# Patient Record
Sex: Female | Born: 1990
Health system: Southern US, Community
[De-identification: ages and names within clinical notes are randomized; demographics above are authoritative.]

## PROBLEM LIST (undated history)

## (undated) DIAGNOSIS — Z789 Other specified health status: Secondary | ICD-10-CM

## (undated) HISTORY — DX: Other specified health status: Z78.9

## (undated) HISTORY — PX: NO PAST SURGERIES: SHX2092

---

## 2018-10-06 ENCOUNTER — Emergency Department (HOSPITAL_BASED_OUTPATIENT_CLINIC_OR_DEPARTMENT_OTHER)
Admission: EM | Admit: 2018-10-06 | Discharge: 2018-10-06 | Disposition: A | Discharge status: Home / Self Care | Payer: 59 | Attending: Emergency Medicine | Admitting: Emergency Medicine

## 2018-10-06 ENCOUNTER — Encounter (HOSPITAL_BASED_OUTPATIENT_CLINIC_OR_DEPARTMENT_OTHER): Payer: Self-pay | Admitting: Emergency Medicine

## 2018-10-06 DIAGNOSIS — T23251A Burn of second degree of right palm, initial encounter: Secondary | ICD-10-CM | POA: Insufficient documentation

## 2018-10-06 DIAGNOSIS — Y999 Unspecified external cause status: Secondary | ICD-10-CM | POA: Insufficient documentation

## 2018-10-06 DIAGNOSIS — Y93G9 Activity, other involving cooking and grilling: Secondary | ICD-10-CM | POA: Diagnosis not present

## 2018-10-06 DIAGNOSIS — X102XXA Contact with fats and cooking oils, initial encounter: Secondary | ICD-10-CM | POA: Diagnosis not present

## 2018-10-06 DIAGNOSIS — T23191A Burn of first degree of multiple sites of right wrist and hand, initial encounter: Secondary | ICD-10-CM

## 2018-10-06 DIAGNOSIS — Y92000 Kitchen of unspecified non-institutional (private) residence as  the place of occurrence of the external cause: Secondary | ICD-10-CM | POA: Diagnosis not present

## 2018-10-06 DIAGNOSIS — T23051A Burn of unspecified degree of right palm, initial encounter: Secondary | ICD-10-CM | POA: Diagnosis present

## 2018-10-06 MED ORDER — ACETAMINOPHEN 500 MG PO TABS
1000.0000 mg | ORAL_TABLET | Freq: Once | ORAL | Status: AC
  Administered 2018-10-06: 1000 mg via ORAL
  Filled 2018-10-06: qty 2

## 2018-10-06 MED ORDER — ACETAMINOPHEN 500 MG PO TABS
ORAL_TABLET | ORAL | Status: AC
  Filled 2018-10-06: qty 1

## 2018-10-06 MED ORDER — SILVER SULFADIAZINE 1 % EX CREA: 1.0000 "application " | TOPICAL_CREAM | Freq: Every day | CUTANEOUS | 0 refills | Status: DC

## 2018-10-06 NOTE — ED Notes (Signed)
ED Provider at bedside. 
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
## 2018-10-06 NOTE — Discharge Instructions (Signed)
Take Tylenol as needed for pain.
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
## 2018-10-24 ENCOUNTER — Ambulatory Visit (INDEPENDENT_AMBULATORY_CARE_PROVIDER_SITE_OTHER): Payer: 59 | Admitting: Obstetrics & Gynecology

## 2018-10-24 ENCOUNTER — Encounter: Payer: Self-pay | Admitting: Obstetrics & Gynecology

## 2018-10-24 ENCOUNTER — Other Ambulatory Visit: Payer: Self-pay

## 2018-10-24 DIAGNOSIS — Z124 Encounter for screening for malignant neoplasm of cervix: Secondary | ICD-10-CM | POA: Diagnosis not present

## 2018-10-24 DIAGNOSIS — Z3A08 8 weeks gestation of pregnancy: Secondary | ICD-10-CM

## 2018-10-24 DIAGNOSIS — Z3401 Encounter for supervision of normal first pregnancy, first trimester: Secondary | ICD-10-CM

## 2018-10-24 DIAGNOSIS — Z113 Encounter for screening for infections with a predominantly sexual mode of transmission: Secondary | ICD-10-CM

## 2018-10-24 DIAGNOSIS — Z3400 Encounter for supervision of normal first pregnancy, unspecified trimester: Secondary | ICD-10-CM | POA: Insufficient documentation

## 2018-10-24 MED ORDER — DOXYLAMINE-PYRIDOXINE 10-10 MG PO TBEC: 2.0000 | DELAYED_RELEASE_TABLET | Freq: Every day | ORAL | 3 refills | Status: DC

## 2018-10-24 MED ORDER — PRENATAL VITAMIN 27-0.8 MG PO TABS: 1.0000 | ORAL_TABLET | Freq: Every day | ORAL | 4 refills | Status: DC

## 2018-10-24 MED FILL — PRENATAL VITAMIN PLUS LOW I: 27-1 | 30 days supply | Qty: 30 | Fill #0

## 2018-10-24 NOTE — Patient Instructions (Signed)
First Trimester of Pregnancy The first trimester of pregnancy is from week 1 until the end of week 13 (months 1 through 3). A week after a sperm fertilizes an egg, the egg will implant on the wall of the uterus. This embryo will begin to develop into a baby. Genes from you and your partner will form the baby. The female genes will determine whether the baby will be a boy or a girl. At 6-8 weeks, the eyes and face will be formed, and the heartbeat can be seen on ultrasound. At the end of 12 weeks, all the baby's organs will be formed. Now that you are pregnant, you will want to do everything you can to have a healthy baby. Two of the most important things are to get good prenatal care and to follow your health care provider's instructions. Prenatal care is all the medical care you receive before the baby's birth. This care will help prevent, find, and treat any problems during the pregnancy and childbirth. Body changes during your first trimester Your body goes through many changes during pregnancy. The changes vary from woman to woman.  You may gain or lose a couple of pounds at first.  You may feel sick to your stomach (nauseous) and you may throw up (vomit). If the vomiting is uncontrollable, call your health care provider.  You may tire easily.  You may develop headaches that can be relieved by medicines. All medicines should be approved by your health care provider.  You may urinate more often. Painful urination may mean you have a bladder infection.  You may develop heartburn as a result of your pregnancy.  You may develop constipation because certain hormones are causing the muscles that push stool through your intestines to slow down.  You may develop hemorrhoids or swollen veins (varicose veins).  Your breasts may begin to grow larger and become tender. Your nipples may stick out more, and the tissue that surrounds them (areola) may become darker.  Your gums may bleed and may be  sensitive to brushing and flossing.  Dark spots or blotches (chloasma, mask of pregnancy) may develop on your face. This will likely fade after the baby is born.  Your menstrual periods will stop.  You may have a loss of appetite.  You may develop cravings for certain kinds of food.  You may have changes in your emotions from day to day, such as being excited to be pregnant or being concerned that something may go wrong with the pregnancy and baby.  You may have more vivid and strange dreams.  You may have changes in your hair. These can include thickening of your hair, rapid growth, and changes in texture. Some women also have hair loss during or after pregnancy, or hair that feels dry or thin. Your hair will most likely return to normal after your baby is born. What to expect at prenatal visits During a routine prenatal visit:  You will be weighed to make sure you and the baby are growing normally.  Your blood pressure will be taken.  Your abdomen will be measured to track your baby's growth.  The fetal heartbeat will be listened to between weeks 10 and 14 of your pregnancy.  Test results from any previous visits will be discussed. Your health care provider may ask you:  How you are feeling.  If you are feeling the baby move.  If you have had any abnormal symptoms, such as leaking fluid, bleeding, severe headaches, or abdominal   cramping.  If you are using any tobacco products, including cigarettes, chewing tobacco, and electronic cigarettes.  If you have any questions. Other tests that may be performed during your first trimester include:  Blood tests to find your blood type and to check for the presence of any previous infections. The tests will also be used to check for low iron levels (anemia) and protein on red blood cells (Rh antibodies). Depending on your risk factors, or if you previously had diabetes during pregnancy, you may have tests to check for high blood sugar  that affects pregnant women (gestational diabetes).  Urine tests to check for infections, diabetes, or protein in the urine.  An ultrasound to confirm the proper growth and development of the baby.  Fetal screens for spinal cord problems (spina bifida) and Down syndrome.  HIV (human immunodeficiency virus) testing. Routine prenatal testing includes screening for HIV, unless you choose not to have this test.  You may need other tests to make sure you and the baby are doing well. Follow these instructions at home: Medicines  Follow your health care provider's instructions regarding medicine use. Specific medicines may be either safe or unsafe to take during pregnancy.  Take a prenatal vitamin that contains at least 600 micrograms (mcg) of folic acid.  If you develop constipation, try taking a stool softener if your health care provider approves. Eating and drinking   Eat a balanced diet that includes fresh fruits and vegetables, whole grains, good sources of protein such as meat, eggs, or tofu, and low-fat dairy. Your health care provider will help you determine the amount of weight gain that is right for you.  Avoid raw meat and uncooked cheese. These carry germs that can cause birth defects in the baby.  Eating four or five small meals rather than three large meals a day may help relieve nausea and vomiting. If you start to feel nauseous, eating a few soda crackers can be helpful. Drinking liquids between meals, instead of during meals, also seems to help ease nausea and vomiting.  Limit foods that are high in fat and processed sugars, such as fried and sweet foods.  To prevent constipation: ? Eat foods that are high in fiber, such as fresh fruits and vegetables, whole grains, and beans. ? Drink enough fluid to keep your urine clear or pale yellow. Activity  Exercise only as directed by your health care provider. Most women can continue their usual exercise routine during  pregnancy. Try to exercise for 30 minutes at least 5 days a week. Exercising will help you: ? Control your weight. ? Stay in shape. ? Be prepared for labor and delivery.  Experiencing pain or cramping in the lower abdomen or lower back is a good sign that you should stop exercising. Check with your health care provider before continuing with normal exercises.  Try to avoid standing for long periods of time. Move your legs often if you must stand in one place for a long time.  Avoid heavy lifting.  Wear low-heeled shoes and practice good posture.  You may continue to have sex unless your health care provider tells you not to. Relieving pain and discomfort  Wear a good support bra to relieve breast tenderness.  Take warm sitz baths to soothe any pain or discomfort caused by hemorrhoids. Use hemorrhoid cream if your health care provider approves.  Rest with your legs elevated if you have leg cramps or low back pain.  If you develop varicose veins in   your legs, wear support hose. Elevate your feet for 15 minutes, 3-4 times a day. Limit salt in your diet. Prenatal care  Schedule your prenatal visits by the twelfth week of pregnancy. They are usually scheduled monthly at first, then more often in the last 2 months before delivery.  Write down your questions. Take them to your prenatal visits.  Keep all your prenatal visits as told by your health care provider. This is important. Safety  Wear your seat belt at all times when driving.  Make a list of emergency phone numbers, including numbers for family, friends, the hospital, and police and fire departments. General instructions  Ask your health care provider for a referral to a local prenatal education class. Begin classes no later than the beginning of month 6 of your pregnancy.  Ask for help if you have counseling or nutritional needs during pregnancy. Your health care provider can offer advice or refer you to specialists for help  with various needs.  Do not use hot tubs, steam rooms, or saunas.  Do not douche or use tampons or scented sanitary pads.  Do not cross your legs for long periods of time.  Avoid cat litter boxes and soil used by cats. These carry germs that can cause birth defects in the baby and possibly loss of the fetus by miscarriage or stillbirth.  Avoid all smoking, herbs, alcohol, and medicines not prescribed by your health care provider. Chemicals in these products affect the formation and growth of the baby.  Do not use any products that contain nicotine or tobacco, such as cigarettes and e-cigarettes. If you need help quitting, ask your health care provider. You may receive counseling support and other resources to help you quit.  Schedule a dentist appointment. At home, brush your teeth with a soft toothbrush and be gentle when you floss. Contact a health care provider if:  You have dizziness.  You have mild pelvic cramps, pelvic pressure, or nagging pain in the abdominal area.  You have persistent nausea, vomiting, or diarrhea.  You have a bad smelling vaginal discharge.  You have pain when you urinate.  You notice increased swelling in your face, hands, legs, or ankles.  You are exposed to fifth disease or chickenpox.  You are exposed to German measles (rubella) and have never had it. Get help right away if:  You have a fever.  You are leaking fluid from your vagina.  You have spotting or bleeding from your vagina.  You have severe abdominal cramping or pain.  You have rapid weight gain or loss.  You vomit blood or material that looks like coffee grounds.  You develop a severe headache.  You have shortness of breath.  You have any kind of trauma, such as from a fall or a car accident. Summary  The first trimester of pregnancy is from week 1 until the end of week 13 (months 1 through 3).  Your body goes through many changes during pregnancy. The changes vary from  woman to woman.  You will have routine prenatal visits. During those visits, your health care provider will examine you, discuss any test results you may have, and talk with you about how you are feeling. This information is not intended to replace advice given to you by your health care provider. Make sure you discuss any questions you have with your health care provider. Document Released: 01/24/2001 Document Revised: 01/12/2017 Document Reviewed: 01/12/2016 Elsevier Patient Education  2020 [employer]  

## 2018-10-24 NOTE — Progress Notes (Signed)
  Subjective:    Karen Patel is being seen today for her first obstetrical visit. G1P0  This is a planned pregnancy. She is at [redacted]w[redacted]d gestation. Her obstetrical history is significant for none. Relationship with FOB: spouse, living together. Patient does intend to breast feed. Pregnancy history fully reviewed. Pt speaks some English. Primary language [place].   Patient reports nausea and emesis. .  Review of Systems:   Review of Systems  Objective:     BP 106/75   Pulse (!) 104   Ht 5' 9.29" (1.76 m)   Wt 160 lb 1.3 oz (72.6 kg)   LMP 08/27/2018 (Exact Date)   BMI 23.44 kg/m  Physical Exam  Exam General Appearance:    Alert, cooperative, no distress, appears stated age  Head:    Normocephalic, without obvious abnormality, atraumatic  Eyes:    conjunctiva/corneas clear, EOM's intact, both eyes  Ears:    Normal external ear canals, both ears  Nose:   Nares normal, septum midline, mucosa normal, no drainage    or sinus tenderness  Throat:   Lips, mucosa, and tongue normal; teeth and gums normal  Neck:   Supple, symmetrical, trachea midline, no adenopathy;    thyroid:  no enlargement/tenderness/nodules  Back:     Symmetric, no curvature, ROM normal, no CVA tenderness  Lungs:     respirations unlabored  Chest Wall:    No tenderness or deformity   Heart:    Regular rate and rhythm  Breast Exam:    No tenderness, masses, or nipple abnormality  Abdomen:     Soft, non-tender, bowel sounds active all four quadrants,    no masses, no organomegaly  Genitalia:    Normal female without lesion, discharge or tenderness     Extremities:   Extremities normal, atraumatic, no cyanosis or edema  Pulses:   2+ and symmetric all extremities  Skin:   Skin color, texture, turgor normal, no rashes or lesions     Assessment:    Pregnancy: G1P0 Patient Active Problem List   Diagnosis Date Noted  . Supervision of normal first pregnancy, antepartum 10/24/2018       Plan:     Initial labs  drawn. Prenatal vitamins. Problem list reviewed and updated. AFP3 discussed: requested. Role of ultrasound in pregnancy discussed; fetal survey: requested. Amniocentesis discussed: not indicated. Follow up in 5 weeks Pt is heading to [place] in 2 weeks. She will be there until early Nov.   60% of 40 min visit spent on counseling and coordination of care.   Had [place] today to confirm dates.   A turkish interpreter was used for entire exam    Lavonia Drafts 10/24/2018

## 2018-10-24 NOTE — Progress Notes (Signed)
DATING AND VIABILITY SONOGRAM   Karen Patel is a 28 y.o. year old G1P0 with LMP Patient's last menstrual period was 08/27/2018 (exact date). which would correlate to  [redacted]w[redacted]d weeks gestation.  She has regular menstrual cycles.   She is here today for a confirmatory initial sonogram.    GESTATION: SINGLETON     FETAL ACTIVITY:          Heart rate       166 bpm          The fetus is active.     GESTATIONAL AGE AND  BIOMETRICS:  Gestational criteria: Estimated Date of Delivery: 06/03/19 by LMP now at [redacted]w[redacted]d  Previous Scans:0      CROWN RUMP LENGTH           1.52 cm        7-6 weeks                                                                               AVERAGE EGA(BY THIS SCAN): 7-6 weeks  WORKING EDD(LMP ):  06/03/2019     TECHNICIAN COMMENTS: Patient informed that the ultrasound is considered a limited obstetric ultrasound and is not intended to be a complete ultrasound exam. Patient also informed that the ultrasound is not being completed with the intent of assessing for fetal or placental anomalies or any pelvic abnormalities. Explained that the purpose of today's ultrasound is to assess for fetal heart rate. Patient acknowledges the purpose of the exam and the limitations of the study.  Kathrene Alu 10/24/2018 2:12 PM

## 2018-10-27 LAB — URINE CULTURE, OB REFLEX

## 2018-10-27 LAB — CULTURE, OB URINE

## 2018-10-28 LAB — CYTOLOGY - PAP
Chlamydia: NEGATIVE
Diagnosis: NEGATIVE
Neisseria Gonorrhea: NEGATIVE

## 2018-11-01 MED FILL — PRENATAL VITAMIN PLUS LOW I: 27-1 | 30 days supply | Qty: 30 | Fill #1

## 2018-11-01 MED FILL — PRENATAL VITAMIN PLUS LOW I: 27-1 | 30 days supply | Qty: 30 | Fill #2

## 2018-11-05 LAB — OBSTETRIC PANEL, INCLUDING HIV
Antibody Screen: NEGATIVE
Basophils Absolute: 0 10*3/uL (ref 0.0–0.2)
Basos: 0 %
EOS (ABSOLUTE): 0 10*3/uL (ref 0.0–0.4)
Eos: 0 %
HIV Screen 4th Generation wRfx: NONREACTIVE
Hematocrit: 36.1 % (ref 34.0–46.6)
Hemoglobin: 12.2 g/dL (ref 11.1–15.9)
Hepatitis B Surface Ag: NEGATIVE
Immature Grans (Abs): 0 10*3/uL (ref 0.0–0.1)
Immature Granulocytes: 0 %
Lymphocytes Absolute: 1.2 10*3/uL (ref 0.7–3.1)
Lymphs: 13 %
MCH: 30 pg (ref 26.6–33.0)
MCHC: 33.8 g/dL (ref 31.5–35.7)
MCV: 89 fL (ref 79–97)
Monocytes Absolute: 0.6 10*3/uL (ref 0.1–0.9)
Monocytes: 7 %
Neutrophils Absolute: 7.7 10*3/uL — ABNORMAL HIGH (ref 1.4–7.0)
Neutrophils: 80 %
Platelets: 237 10*3/uL (ref 150–450)
RBC: 4.07 x10E6/uL (ref 3.77–5.28)
RDW: 12.6 % (ref 11.7–15.4)
RPR Ser Ql: NONREACTIVE
Rh Factor: POSITIVE
Rubella Antibodies, IGG: 23.5 index (ref >0.99)
WBC: 9.7 10*3/uL (ref 3.4–10.8)

## 2018-11-05 LAB — CYSTIC FIBROSIS GENE TEST

## 2018-11-05 LAB — SMN1 COPY NUMBER ANALYSIS (SMA CARRIER SCREENING)

## 2018-11-05 LAB — HEMOGLOBIN A1C
Est. average glucose Bld gHb Est-mCnc: 100 mg/dL
Hgb A1c MFr Bld: 5.1 % (ref 4.8–5.6)

## 2019-01-06 ENCOUNTER — Encounter: Payer: 59 | Admitting: Obstetrics & Gynecology

## 2019-01-22 ENCOUNTER — Other Ambulatory Visit: Payer: Self-pay

## 2019-01-22 ENCOUNTER — Ambulatory Visit (INDEPENDENT_AMBULATORY_CARE_PROVIDER_SITE_OTHER): Payer: 59 | Admitting: Obstetrics & Gynecology

## 2019-01-22 VITALS — BP 120/75 | HR 101 | Wt 172.0 lb

## 2019-01-22 DIAGNOSIS — M5431 Sciatica, right side: Secondary | ICD-10-CM

## 2019-01-22 DIAGNOSIS — Z3400 Encounter for supervision of normal first pregnancy, unspecified trimester: Secondary | ICD-10-CM

## 2019-01-22 DIAGNOSIS — O99891 Other specified diseases and conditions complicating pregnancy: Secondary | ICD-10-CM

## 2019-01-22 DIAGNOSIS — Z23 Encounter for immunization: Secondary | ICD-10-CM

## 2019-01-22 DIAGNOSIS — K219 Gastro-esophageal reflux disease without esophagitis: Secondary | ICD-10-CM | POA: Insufficient documentation

## 2019-01-22 DIAGNOSIS — Z3A21 21 weeks gestation of pregnancy: Secondary | ICD-10-CM

## 2019-01-22 DIAGNOSIS — O99619 Diseases of the digestive system complicating pregnancy, unspecified trimester: Secondary | ICD-10-CM

## 2019-01-22 MED ORDER — OMEPRAZOLE 20 MG PO CPDR: 20.0000 mg | DELAYED_RELEASE_CAPSULE | Freq: Every day | ORAL | 6 refills | Status: DC

## 2019-01-22 MED FILL — OMEPRAZOLE 20 MG CAP: 20 | 30 days supply | Qty: 30 | Fill #0

## 2019-01-22 NOTE — Progress Notes (Signed)
   PRENATAL VISIT NOTE  Subjective:  Karen Patel is a 28 y.o. G1P0 at [redacted]w[redacted]d being seen today for ongoing prenatal care.  She is currently monitored for the following issues for this low risk  pregnancy and has Supervision of normal first pregnancy, antepartum; Sciatica of right side; and Gastroesophageal reflux during pregnancy, antepartum on their problem list.  Patient reports pt reports refulx not controlled with 'Gaviscon' and scitica on the right side.  .  Contractions: Not present. Vag. Bleeding: None.  Movement: Present. Denies leaking of fluid.   The following portions of the patient's history were reviewed and updated as appropriate: allergies, current medications, past family history, past medical history, past social history, past surgical history and problem list.   Objective:   Vitals:   01/22/19 1411  BP: 120/75  Pulse: (!) 101  Weight: 172 lb (78 kg)    Fetal Status: Fetal Heart Rate (bpm): 155   Movement: Present     General:  Alert, oriented and cooperative. Patient is in no acute distress.  Skin: Skin is warm and dry. No rash noted.   Cardiovascular: Normal heart rate noted  Respiratory: Normal respiratory effort, no problems with respiration noted  Abdomen: Soft, gravid, appropriate for gestational age.  Pain/Pressure: Present     Pelvic: Cervical exam deferred        Extremities: Normal range of motion.  Edema: Trace  Mental Status: Normal mood and affect. Normal behavior. Normal judgment and thought content.   Assessment and Plan:  Pregnancy: G1P0 at [redacted]w[redacted]d 1. Supervision of normal first pregnancy, antepartum Good FM  schedule Anatomy scan   2. Reflux sx.  Prilosec prn  3. Sciatica-  Reviewed exercises to perform     Preterm labor symptoms and general obstetric precautions including but not limited to vaginal bleeding, contractions, leaking of fluid and fetal movement were reviewed in detail with the patient. Please refer to After Visit Summary for  other counseling recommendations.   Return in about 4 weeks (around 02/19/2019) for Web based.  Future Appointments  Date Time Provider [place]  02/26/2019  1:30 PM Lavonia Drafts, MD [facility] None    Lavonia Drafts, MD

## 2019-01-22 NOTE — Progress Notes (Signed)
[place] interpreter [place] [phone].

## 2019-01-22 NOTE — Addendum Note (Signed)
Addended by: Phill Myron on: 01/22/2019 03:48 PM   Modules accepted: Orders

## 2019-02-12 ENCOUNTER — Other Ambulatory Visit: Payer: Self-pay | Admitting: Obstetrics & Gynecology

## 2019-02-12 ENCOUNTER — Other Ambulatory Visit: Payer: Self-pay

## 2019-02-12 ENCOUNTER — Ambulatory Visit (HOSPITAL_COMMUNITY)
Admission: RE | Admit: 2019-02-12 | Discharge: 2019-02-12 | Disposition: A | Discharge status: Home / Self Care | Payer: 59 | Source: Ambulatory Visit | Attending: Obstetrics & Gynecology | Admitting: Obstetrics & Gynecology

## 2019-02-12 ENCOUNTER — Other Ambulatory Visit (HOSPITAL_COMMUNITY): Payer: Self-pay | Admitting: *Deleted

## 2019-02-12 DIAGNOSIS — O283 Abnormal ultrasonic finding on antenatal screening of mother: Secondary | ICD-10-CM | POA: Diagnosis not present

## 2019-02-12 DIAGNOSIS — Z3A24 24 weeks gestation of pregnancy: Secondary | ICD-10-CM | POA: Diagnosis not present

## 2019-02-12 DIAGNOSIS — O358XX0 Maternal care for other (suspected) fetal abnormality and damage, not applicable or unspecified: Secondary | ICD-10-CM

## 2019-02-12 DIAGNOSIS — Z3400 Encounter for supervision of normal first pregnancy, unspecified trimester: Secondary | ICD-10-CM | POA: Insufficient documentation

## 2019-02-12 DIAGNOSIS — O35EXX0 Maternal care for other (suspected) fetal abnormality and damage, fetal genitourinary anomalies, not applicable or unspecified: Secondary | ICD-10-CM

## 2019-02-14 HISTORY — PX: CHOLECYSTECTOMY, LAPAROSCOPIC: SHX56

## 2019-02-14 NOTE — L&D Delivery Note (Signed)
OB/GYN Faculty Practice Delivery Note  Karen Patel is a 29 y.o. G1P0 s/p VD at [redacted]w[redacted]d. She was admitted for PD IOL.   ROM: 5h 15m with clear fluid GBS Status: Positive/-- (03/22 1018) with adequate ppx Maximum Maternal Temperature: 98.64F  Labor Progress: . Initial SVE: 1.5/60/-1. Patient received Foley bulb, Pitocin and AROM. Received epidural. She then progressed to complete.   Delivery Date/Time: 4/28 @ [phone] Delivery: Called to room and patient was complete and pushing. Head delivered LOA position. Nuchal cord present and reduced after delivery. Shoulder and body delivered in usual fashion. Infant with spontaneous cry, placed on mother's abdomen, dried and stimulated. Cord clamped x 2 after 1-minute delay, and cut by FOB. Cord blood drawn. Placenta delivered spontaneously with gentle cord traction. Fundus firm with massage and Pitocin. Labia, perineum, vagina, and cervix inspected inspected with 3b laceration and repaired with 3-0 Vicryl in a standard fashion. Dr. Adrian Blackwater present.  Baby Weight: pending  Placenta: Sent to L&D Complications: None Lacerations: 3b perineal  EBL: 637 mL Analgesia: Epidural and local lidocaine for repair   Infant:  APGAR (1 MIN): 8   APGAR (5 MINS): 9   APGAR (10 MINS):     Jerilynn Birkenhead, MD OB Family Medicine Fellow, [facility] for [facility], [facility] 06/11/2019, 8:17 AM

## 2019-02-25 ENCOUNTER — Telehealth: Payer: Self-pay

## 2019-02-25 NOTE — Telephone Encounter (Signed)
Pt's husband called the office back in regards to the Mychart message that the pt sent on 02/18/19. Explained to pt's husband that per Dr. Erin Fulling, all results will be discussed at office visit on 02/26/19. Understanding was voiced. Shykeem Resurreccion l Shadee Montoya, CMA

## 2019-02-25 NOTE — Telephone Encounter (Signed)
Called pt with [facility] interpreter Derya [phone]. Unable to leave message because vm was not set up. chiquita l wilson, CMA

## 2019-02-26 ENCOUNTER — Other Ambulatory Visit: Payer: Self-pay

## 2019-02-26 ENCOUNTER — Encounter: Payer: Self-pay | Admitting: Obstetrics & Gynecology

## 2019-02-26 ENCOUNTER — Ambulatory Visit (INDEPENDENT_AMBULATORY_CARE_PROVIDER_SITE_OTHER): Payer: 59 | Admitting: Obstetrics & Gynecology

## 2019-02-26 VITALS — BP 128/74 | HR 115 | Wt 178.0 lb

## 2019-02-26 DIAGNOSIS — Z3A26 26 weeks gestation of pregnancy: Secondary | ICD-10-CM

## 2019-02-26 DIAGNOSIS — O99612 Diseases of the digestive system complicating pregnancy, second trimester: Secondary | ICD-10-CM

## 2019-02-26 DIAGNOSIS — M5431 Sciatica, right side: Secondary | ICD-10-CM

## 2019-02-26 DIAGNOSIS — K219 Gastro-esophageal reflux disease without esophagitis: Secondary | ICD-10-CM

## 2019-02-26 DIAGNOSIS — Z3400 Encounter for supervision of normal first pregnancy, unspecified trimester: Secondary | ICD-10-CM

## 2019-02-26 NOTE — Progress Notes (Signed)
   PRENATAL VISIT NOTE  Subjective:  Karen Patel is a 29 y.o. G1P0 at [redacted]w[redacted]d being seen today for ongoing prenatal care.  She is currently monitored for the following issues for this low-risk pregnancy and has Supervision of normal first pregnancy, antepartum; Sciatica of right side; and Gastroesophageal reflux during pregnancy, antepartum on their problem list.  Patient reports occ cramping in legs. teh sciatica is gone..  Contractions: Not present. Vag. Bleeding: None.  Movement: Present. Denies leaking of fluid.   The following portions of the patient's history were reviewed and updated as appropriate: allergies, current medications, past family history, past medical history, past social history, past surgical history and problem list.   Objective:   Vitals:   02/26/19 1322  BP: 128/74  Pulse: (!) 115  Weight: 178 lb (80.7 kg)    Fetal Status: Fetal Heart Rate (bpm): 145   Movement: Present     General:  Alert, oriented and cooperative. Patient is in no acute distress.  Skin: Skin is warm and dry. No rash noted.   Cardiovascular: Normal heart rate noted  Respiratory: Normal respiratory effort, no problems with respiration noted  Abdomen: Soft, gravid, appropriate for gestational age.  Pain/Pressure: Present     Pelvic: Cervical exam deferred        Extremities: Normal range of motion.  Edema: Trace  Mental Status: Normal mood and affect. Normal behavior. Normal judgment and thought content.   Assessment and Plan:  Pregnancy: G1P0 at [redacted]w[redacted]d 1. Supervision of normal first pregnancy, antepartum Good FM FH WNL NIPTS today 28 week labs on next visit  2. Gastroesophageal reflux during pregnancy, antepartum Improved.   3. Sciatica of right side Imporved Occ cramping in low legs. Will start Ca++  Preterm labor symptoms and general obstetric precautions including but not limited to vaginal bleeding, contractions, leaking of fluid and fetal movement were reviewed in detail with  the patient. Please refer to After Visit Summary for other counseling recommendations.   No follow-ups on file.  Future Appointments  Date Time Provider Department Center  03/13/2019 12:45 PM [facility] NURSE [facility]  03/13/2019 12:45 PM [facility] [place] 2 [facility]    Willodean Rosenthal, MD

## 2019-02-26 NOTE — Progress Notes (Signed)
Interpreter Emre #[phone] used for entire visit.   Patient request Panorama done today due to ultrasound (MFM) finding some markers for Down Syndrome at her last ultrasound appointment.  Discussed and explained that at her next visit we will be preforming the 2 hour glucose test. Answered questions about this test with her. Armandina Stammer RN

## 2019-03-07 DIAGNOSIS — Z3400 Encounter for supervision of normal first pregnancy, unspecified trimester: Secondary | ICD-10-CM

## 2019-03-11 ENCOUNTER — Other Ambulatory Visit: Payer: Self-pay

## 2019-03-11 ENCOUNTER — Encounter: Payer: Self-pay | Admitting: Advanced Practice Midwife

## 2019-03-11 ENCOUNTER — Ambulatory Visit (INDEPENDENT_AMBULATORY_CARE_PROVIDER_SITE_OTHER): Payer: 59 | Admitting: Advanced Practice Midwife

## 2019-03-11 VITALS — BP 108/65 | HR 93 | Wt 181.0 lb

## 2019-03-11 DIAGNOSIS — Z23 Encounter for immunization: Secondary | ICD-10-CM | POA: Diagnosis not present

## 2019-03-11 DIAGNOSIS — Z3400 Encounter for supervision of normal first pregnancy, unspecified trimester: Secondary | ICD-10-CM

## 2019-03-11 DIAGNOSIS — O283 Abnormal ultrasonic finding on antenatal screening of mother: Secondary | ICD-10-CM

## 2019-03-11 DIAGNOSIS — Z3A28 28 weeks gestation of pregnancy: Secondary | ICD-10-CM

## 2019-03-11 DIAGNOSIS — O358XX0 Maternal care for other (suspected) fetal abnormality and damage, not applicable or unspecified: Secondary | ICD-10-CM

## 2019-03-11 DIAGNOSIS — O35EXX0 Maternal care for other (suspected) fetal abnormality and damage, fetal genitourinary anomalies, not applicable or unspecified: Secondary | ICD-10-CM

## 2019-03-11 NOTE — Progress Notes (Signed)
[place] intrepreter #[id number]. Armandina Stammer RN

## 2019-03-11 NOTE — Progress Notes (Signed)
   PRENATAL VISIT NOTE  Subjective:  Karen Patel is a 29 y.o. G1P0 at [redacted]w[redacted]d being seen today for ongoing prenatal care.  She is currently monitored for the following issues for this low-risk pregnancy and has Supervision of normal first pregnancy, antepartum; Sciatica of right side; and Gastroesophageal reflux during pregnancy, antepartum on their problem list.  Patient reports no complaints.  Contractions: Not present.  .  Movement: Present. Denies leaking of fluid.   The following portions of the patient's history were reviewed and updated as appropriate: allergies, current medications, past family history, past medical history, past social history, past surgical history and problem list.   Video interpretor used  Objective:   Vitals:   03/11/19 0824  BP: 108/65  Pulse: 93  Weight: 181 lb (82.1 kg)    Fetal Status: Fetal Heart Rate (bpm): 145 Fundal Height: 28 cm Movement: Present     General:  Alert, oriented and cooperative. Patient is in no acute distress.  Skin: Skin is warm and dry. No rash noted.   Cardiovascular: Normal heart rate noted  Respiratory: Normal respiratory effort, no problems with respiration noted  Abdomen: Soft, gravid, appropriate for gestational age.  Pain/Pressure: Present     Pelvic: Cervical exam deferred        Extremities: Normal range of motion.  Edema: Trace  Mental Status: Normal mood and affect. Normal behavior. Normal judgment and thought content.   Assessment and Plan:  Pregnancy: G1P0 at [redacted]w[redacted]d 1. Supervision of normal first pregnancy, antepartum      Reviewed results of Panorama are normal  - Glucose Tolerance, 2 Hours w/1 Hour - RPR - CBC - HIV antibody (with reflex)  2.   Abnormal Fetal Ultrasound      EIF and Pyelectasis seen, followup per MFM     Panorama ordered - normal   Preterm labor symptoms and general obstetric precautions including but not limited to vaginal bleeding, contractions, leaking of fluid and fetal movement were  reviewed in detail with the patient. Please refer to After Visit Summary for other counseling recommendations.   Return in about 2 weeks (around 03/25/2019) for [facility].  Future Appointments  Date Time Provider Department Center  03/13/2019 12:45 PM [facility] NURSE [facility]  03/13/2019 12:45 PM [facility] [place] 2 [facility]  03/27/2019  9:15 AM Willodean Rosenthal, MD [facility] None    Wynelle Bourgeois, CNM

## 2019-03-11 NOTE — Patient Instructions (Signed)

## 2019-03-12 LAB — CBC
Hematocrit: 36.3 % (ref 34.0–46.6)
Hemoglobin: 11.8 g/dL (ref 11.1–15.9)
MCH: 29.8 pg (ref 26.6–33.0)
MCHC: 32.5 g/dL (ref 31.5–35.7)
MCV: 92 fL (ref 79–97)
Platelets: 232 10*3/uL (ref 150–450)
RBC: 3.96 x10E6/uL (ref 3.77–5.28)
RDW: 12.7 % (ref 11.7–15.4)
WBC: 9.6 10*3/uL (ref 3.4–10.8)

## 2019-03-12 LAB — GLUCOSE TOLERANCE, 2 HOURS W/ 1HR
Glucose, 1 hour: 141 mg/dL (ref 65–179)
Glucose, 2 hour: 103 mg/dL (ref 65–152)
Glucose, Fasting: 75 mg/dL (ref 65–91)

## 2019-03-12 LAB — RPR: RPR Ser Ql: NONREACTIVE

## 2019-03-12 LAB — HIV ANTIBODY (ROUTINE TESTING W REFLEX): HIV Screen 4th Generation wRfx: NONREACTIVE

## 2019-03-13 ENCOUNTER — Ambulatory Visit (HOSPITAL_COMMUNITY)
Admission: RE | Admit: 2019-03-13 | Discharge: 2019-03-13 | Disposition: A | Discharge status: Home / Self Care | Payer: 59 | Source: Ambulatory Visit | Attending: Obstetrics and Gynecology | Admitting: Obstetrics and Gynecology

## 2019-03-13 ENCOUNTER — Other Ambulatory Visit: Payer: Self-pay

## 2019-03-13 ENCOUNTER — Ambulatory Visit (HOSPITAL_COMMUNITY): Payer: 59 | Admitting: *Deleted

## 2019-03-13 ENCOUNTER — Encounter (HOSPITAL_COMMUNITY): Payer: Self-pay

## 2019-03-13 DIAGNOSIS — Z3A28 28 weeks gestation of pregnancy: Secondary | ICD-10-CM | POA: Diagnosis not present

## 2019-03-13 DIAGNOSIS — M5431 Sciatica, right side: Secondary | ICD-10-CM | POA: Insufficient documentation

## 2019-03-13 DIAGNOSIS — O358XX0 Maternal care for other (suspected) fetal abnormality and damage, not applicable or unspecified: Secondary | ICD-10-CM | POA: Diagnosis not present

## 2019-03-13 DIAGNOSIS — O99619 Diseases of the digestive system complicating pregnancy, unspecified trimester: Secondary | ICD-10-CM | POA: Insufficient documentation

## 2019-03-13 DIAGNOSIS — Z3400 Encounter for supervision of normal first pregnancy, unspecified trimester: Secondary | ICD-10-CM | POA: Insufficient documentation

## 2019-03-13 DIAGNOSIS — Z362 Encounter for other antenatal screening follow-up: Secondary | ICD-10-CM

## 2019-03-13 DIAGNOSIS — O283 Abnormal ultrasonic finding on antenatal screening of mother: Secondary | ICD-10-CM

## 2019-03-13 DIAGNOSIS — K219 Gastro-esophageal reflux disease without esophagitis: Secondary | ICD-10-CM

## 2019-03-13 DIAGNOSIS — O35EXX0 Maternal care for other (suspected) fetal abnormality and damage, fetal genitourinary anomalies, not applicable or unspecified: Secondary | ICD-10-CM

## 2019-03-27 ENCOUNTER — Ambulatory Visit (INDEPENDENT_AMBULATORY_CARE_PROVIDER_SITE_OTHER): Payer: 59 | Admitting: Obstetrics & Gynecology

## 2019-03-27 ENCOUNTER — Other Ambulatory Visit: Payer: Self-pay

## 2019-03-27 VITALS — BP 114/67 | HR 94 | Wt 183.0 lb

## 2019-03-27 DIAGNOSIS — Z3A30 30 weeks gestation of pregnancy: Secondary | ICD-10-CM

## 2019-03-27 DIAGNOSIS — Z3400 Encounter for supervision of normal first pregnancy, unspecified trimester: Secondary | ICD-10-CM

## 2019-03-27 DIAGNOSIS — K219 Gastro-esophageal reflux disease without esophagitis: Secondary | ICD-10-CM

## 2019-03-27 DIAGNOSIS — Z789 Other specified health status: Secondary | ICD-10-CM | POA: Insufficient documentation

## 2019-03-27 DIAGNOSIS — O99619 Diseases of the digestive system complicating pregnancy, unspecified trimester: Secondary | ICD-10-CM

## 2019-03-27 DIAGNOSIS — M5431 Sciatica, right side: Secondary | ICD-10-CM

## 2019-03-27 DIAGNOSIS — O26893 Other specified pregnancy related conditions, third trimester: Secondary | ICD-10-CM

## 2019-03-27 MED FILL — PRENATAL VITAMIN PLUS LOW I: 27-1 | 30 days supply | Qty: 30 | Fill #3

## 2019-03-27 NOTE — Patient Instructions (Signed)

## 2019-03-27 NOTE — Progress Notes (Addendum)
   PRENATAL VISIT NOTE  Subjective:  Karen Patel is a 29 y.o. G1P0 at [redacted]w[redacted]d being seen today for ongoing prenatal care.  She is currently monitored for the following issues for this low-risk pregnancy and has Supervision of normal first pregnancy, antepartum; Sciatica of right side; and Gastroesophageal reflux during pregnancy, antepartum on their problem list.  Patient reports occ dizziness.  Contractions: Not present. Vag. Bleeding: None.  Movement: Present. Denies leaking of fluid.   The following portions of the patient's history were reviewed and updated as appropriate: allergies, current medications, past family history, past medical history, past social history, past surgical history and problem list.   Objective:   Vitals:   03/27/19 0920  BP: 114/67  Pulse: 94  Weight: 183 lb (83 kg)    Fetal Status: Fetal Heart Rate (bpm): 141   Movement: Present     General:  Alert, oriented and cooperative. Patient is in no acute distress.  Skin: Skin is warm and dry. No rash noted.   Cardiovascular: Normal heart rate noted  Respiratory: Normal respiratory effort, no problems with respiration noted  Abdomen: Soft, gravid, appropriate for gestational age.  Pain/Pressure: Absent     Pelvic: Cervical exam deferred        Extremities: Normal range of motion.  Edema: None  Mental Status: Normal mood and affect. Normal behavior. Normal judgment and thought content.   Assessment and Plan:  Pregnancy: G1P0 at [redacted]w[redacted]d 1. Supervision of normal first pregnancy, antepartum FH WNL Good FM Reviewed contraception/PEDs/delivery location and mode of delivery  2. Sciatica of right side  3. Gastroesophageal reflux during pregnancy, antepartum  4. Language barrier [place] interpreter used for entire visit.   Preterm labor symptoms and general obstetric precautions including but not limited to vaginal bleeding, contractions, leaking of fluid and fetal movement were reviewed in detail with the  patient. Please refer to After Visit Summary for other counseling recommendations.   Return in about 2 weeks (around 04/10/2019) for in person.  No future appointments.  Willodean Rosenthal, MD

## 2019-04-07 ENCOUNTER — Encounter: Payer: 59 | Admitting: Obstetrics & Gynecology

## 2019-04-08 ENCOUNTER — Encounter: Payer: Self-pay | Admitting: Advanced Practice Midwife

## 2019-04-08 ENCOUNTER — Other Ambulatory Visit: Payer: Self-pay

## 2019-04-08 ENCOUNTER — Ambulatory Visit (INDEPENDENT_AMBULATORY_CARE_PROVIDER_SITE_OTHER): Payer: 59 | Admitting: Advanced Practice Midwife

## 2019-04-08 DIAGNOSIS — Z3403 Encounter for supervision of normal first pregnancy, third trimester: Secondary | ICD-10-CM

## 2019-04-08 DIAGNOSIS — Z3400 Encounter for supervision of normal first pregnancy, unspecified trimester: Secondary | ICD-10-CM

## 2019-04-08 DIAGNOSIS — K649 Unspecified hemorrhoids: Secondary | ICD-10-CM

## 2019-04-08 DIAGNOSIS — Z3A32 32 weeks gestation of pregnancy: Secondary | ICD-10-CM

## 2019-04-08 MED ORDER — DOCUSATE SODIUM 100 MG PO CAPS: 100.0000 mg | ORAL_CAPSULE | Freq: Two times a day (BID) | ORAL | 2 refills | Status: DC | PRN

## 2019-04-08 MED FILL — DOCUSATE NA 100 MG SOFTGEL: 100 | 50 days supply | Qty: 100 | Fill #0

## 2019-04-08 NOTE — Progress Notes (Signed)
[facility] language resources Francena Hanly [phone].

## 2019-04-08 NOTE — Progress Notes (Signed)
   PRENATAL VISIT NOTE  Subjective:  Karen Patel is a 29 y.o. G1P0 at [redacted]w[redacted]d being seen today for ongoing prenatal care.  She is currently monitored for the following issues for this low-risk pregnancy and has Supervision of normal first pregnancy, antepartum; Sciatica of right side; Gastroesophageal reflux during pregnancy, antepartum; Language barrier affecting health care; and Hemorrhoids on their problem list.  Patient reports hemorrhoids (using cream, feels bigger), some round ligament pain, some swelling in hands and feet (BPs have been normal).  Contractions: Not present. Vag. Bleeding: None.  Movement: Present. Denies leaking of fluid.   The following portions of the patient's history were reviewed and updated as appropriate: allergies, current medications, past family history, past medical history, past social history, past surgical history and problem list.   Objective:   Vitals:   04/08/19 0956  BP: 118/69  Pulse: (!) 106  Weight: 190 lb 0.6 oz (86.2 kg)    Fetal Status: Fetal Heart Rate (bpm): 156 Fundal Height: 32 cm Movement: Present     General:  Alert, oriented and cooperative. Patient is in no acute distress.  Skin: Skin is warm and dry. No rash noted.   Cardiovascular: Normal heart rate noted  Respiratory: Normal respiratory effort, no problems with respiration noted  Abdomen: Soft, gravid, appropriate for gestational age.  Pain/Pressure: Present     Pelvic: Cervical exam deferred        Extremities: Normal range of motion.  Edema: None  Mental Status: Normal mood and affect. Normal behavior. Normal judgment and thought content.   Assessment and Plan:  Pregnancy: G1P0 at [redacted]w[redacted]d 1. Hemorrhoids, unspecified hemorrhoid type     Will add Rx for Colace  2. Supervision of normal first pregnancy, antepartum      Doing well      Reassured mild swelling is normal as long as BPs normal  3.   Language Psychologist, educational used throughout visit  Preterm labor  symptoms and general obstetric precautions including but not limited to vaginal bleeding, contractions, leaking of fluid and fetal movement were reviewed in detail with the patient. Please refer to After Visit Summary for other counseling recommendations.   Return in about 4 weeks (around 05/06/2019) for [facility]. (per [employer])  No future appointments.  Wynelle Bourgeois, CNM

## 2019-04-08 NOTE — Patient Instructions (Signed)

## 2019-04-23 ENCOUNTER — Ambulatory Visit (INDEPENDENT_AMBULATORY_CARE_PROVIDER_SITE_OTHER): Payer: 59 | Admitting: Obstetrics & Gynecology

## 2019-04-23 ENCOUNTER — Other Ambulatory Visit: Payer: Self-pay

## 2019-04-23 VITALS — BP 125/78 | HR 98

## 2019-04-23 DIAGNOSIS — M5431 Sciatica, right side: Secondary | ICD-10-CM

## 2019-04-23 DIAGNOSIS — O99619 Diseases of the digestive system complicating pregnancy, unspecified trimester: Secondary | ICD-10-CM

## 2019-04-23 DIAGNOSIS — Z3400 Encounter for supervision of normal first pregnancy, unspecified trimester: Secondary | ICD-10-CM

## 2019-04-23 DIAGNOSIS — Z789 Other specified health status: Secondary | ICD-10-CM

## 2019-04-23 DIAGNOSIS — O99613 Diseases of the digestive system complicating pregnancy, third trimester: Secondary | ICD-10-CM

## 2019-04-23 DIAGNOSIS — K219 Gastro-esophageal reflux disease without esophagitis: Secondary | ICD-10-CM

## 2019-04-23 DIAGNOSIS — Z3A34 34 weeks gestation of pregnancy: Secondary | ICD-10-CM

## 2019-04-23 DIAGNOSIS — K649 Unspecified hemorrhoids: Secondary | ICD-10-CM

## 2019-04-23 NOTE — Progress Notes (Signed)
Interpreter [phone] [place] -used for visit today. Armandina Stammer RN

## 2019-04-23 NOTE — Progress Notes (Signed)
   PRENATAL VISIT NOTE  Subjective:  Karen Patel is a 29 y.o. G1P0 at [redacted]w[redacted]d being seen today for ongoing prenatal care.  She is currently monitored for the following issues for this low-risk pregnancy and has Supervision of normal first pregnancy, antepartum; Sciatica of right side; Gastroesophageal reflux during pregnancy, antepartum; Language barrier affecting health care; and Hemorrhoids on their problem list.  Patient reports occ pressure in abd that extends to her back.  Contractions: Not present. Vag. Bleeding: None.  Movement: Present. Denies leaking of fluid. Does report vaginal discharge.   The following portions of the patient's history were reviewed and updated as appropriate: allergies, current medications, past family history, past medical history, past social history, past surgical history and problem list.   Objective:   Vitals:   04/23/19 1529  BP: 125/78  Pulse: 98    Fetal Status: Fetal Heart Rate (bpm): 150 Fundal Height: 35 cm Movement: Present     General:  Alert, oriented and cooperative. Patient is in no acute distress.  Skin: Skin is warm and dry. No rash noted.   Cardiovascular: Normal heart rate noted  Respiratory: Normal respiratory effort, no problems with respiration noted  Abdomen: Soft, gravid, appropriate for gestational age.  Pain/Pressure: Present     Pelvic: Cervical exam deferred        Extremities: Normal range of motion.  Edema: None  Mental Status: Normal mood and affect. Normal behavior. Normal judgment and thought content.   Assessment and Plan:  Pregnancy: G1P0 at [redacted]w[redacted]d 1. Supervision of normal first pregnancy, antepartum FH wnl  FHR WNL Reviewed [facility] ctx Pt declined pelvic exam as she will be examined next week  2. Language barrier affecting health care Interpreter used for entire visit.   Preterm labor symptoms and general obstetric precautions including but not limited to vaginal bleeding, contractions, leaking of fluid and fetal  movement were reviewed in detail with the patient. Please refer to After Visit Summary for other counseling recommendations.   Return in about 2 weeks (around 05/07/2019) for in person.  Future Appointments  Date Time Provider Department Center  05/05/2019  9:15 AM Willodean Rosenthal, MD [facility] None    Willodean Rosenthal, MD

## 2019-04-23 NOTE — Patient Instructions (Signed)
Third Trimester of Pregnancy The third trimester is from week 28 through week 40 (months 7 through 9). The third trimester is a time when the unborn baby (fetus) is growing rapidly. At the end of the ninth month, the fetus is about 20 inches in length and weighs 6-10 pounds. Body changes during your third trimester Your body will continue to go through many changes during pregnancy. The changes vary from woman to woman. During the third trimester:  Your weight will continue to increase. You can expect to gain 25-35 pounds (11-16 kg) by the end of the pregnancy.  You may begin to get stretch marks on your hips, abdomen, and breasts.  You may urinate more often because the fetus is moving lower into your pelvis and pressing on your bladder.  You may develop or continue to have heartburn. This is caused by increased hormones that slow down muscles in the digestive tract.  You may develop or continue to have constipation because increased hormones slow digestion and cause the muscles that push waste through your intestines to relax.  You may develop hemorrhoids. These are swollen veins (varicose veins) in the rectum that can itch or be painful.  You may develop swollen, bulging veins (varicose veins) in your legs.  You may have increased body aches in the pelvis, back, or thighs. This is due to weight gain and increased hormones that are relaxing your joints.  You may have changes in your hair. These can include thickening of your hair, rapid growth, and changes in texture. Some women also have hair loss during or after pregnancy, or hair that feels dry or thin. Your hair will most likely return to normal after your baby is born.  Your breasts will continue to grow and they will continue to become tender. A yellow fluid (colostrum) may leak from your breasts. This is the first milk you are producing for your baby.  Your belly button may stick out.  You may notice more swelling in your hands,  face, or ankles.  You may have increased tingling or numbness in your hands, arms, and legs. The skin on your belly may also feel numb.  You may feel short of breath because of your expanding uterus.  You may have more problems sleeping. This can be caused by the size of your belly, increased need to urinate, and an increase in your body's metabolism.  You may notice the fetus "dropping," or moving lower in your abdomen (lightening).  You may have increased vaginal discharge.  You may notice your joints feel loose and you may have pain around your pelvic bone. What to expect at prenatal visits You will have prenatal exams every 2 weeks until week 36. Then you will have weekly prenatal exams. During a routine prenatal visit:  You will be weighed to make sure you and the baby are growing normally.  Your blood pressure will be taken.  Your abdomen will be measured to track your baby's growth.  The fetal heartbeat will be listened to.  Any test results from the previous visit will be discussed.  You may have a cervical check near your due date to see if your cervix has softened or thinned (effaced).  You will be tested for Group B streptococcus. This happens between 35 and 37 weeks. Your health care provider may ask you:  What your birth plan is.  How you are feeling.  If you are feeling the baby move.  If you have had any abnormal   symptoms, such as leaking fluid, bleeding, severe headaches, or abdominal cramping.  If you are using any tobacco products, including cigarettes, chewing tobacco, and electronic cigarettes.  If you have any questions. Other tests or screenings that may be performed during your third trimester include:  Blood tests that check for low iron levels (anemia).  Fetal testing to check the health, activity level, and growth of the fetus. Testing is done if you have certain medical conditions or if there are problems during the pregnancy.  Nonstress test  (NST). This test checks the health of your baby to make sure there are no signs of problems, such as the baby not getting enough oxygen. During this test, a belt is placed around your belly. The baby is made to move, and its heart rate is monitored during movement. What is false labor? False labor is a condition in which you feel small, irregular tightenings of the muscles in the womb (contractions) that usually go away with rest, changing position, or drinking water. These are called Braxton Hicks contractions. Contractions may last for hours, days, or even weeks before true labor sets in. If contractions come at regular intervals, become more frequent, increase in intensity, or become painful, you should see your health care provider. What are the signs of labor?  Abdominal cramps.  Regular contractions that start at 10 minutes apart and become stronger and more frequent with time.  Contractions that start on the top of the uterus and spread down to the lower abdomen and back.  Increased pelvic pressure and dull back pain.  A watery or bloody mucus discharge that comes from the vagina.  Leaking of amniotic fluid. This is also known as your "water breaking." It could be a slow trickle or a gush. Let your health care provider know if it has a color or strange odor. If you have any of these signs, call your health care provider right away, even if it is before your due date. Follow these instructions at home: Medicines  Follow your health care provider's instructions regarding medicine use. Specific medicines may be either safe or unsafe to take during pregnancy.  Take a prenatal vitamin that contains at least 600 micrograms (mcg) of folic acid.  If you develop constipation, try taking a stool softener if your health care provider approves. Eating and drinking   Eat a balanced diet that includes fresh fruits and vegetables, whole grains, good sources of protein such as meat, eggs, or tofu,  and low-fat dairy. Your health care provider will help you determine the amount of weight gain that is right for you.  Avoid raw meat and uncooked cheese. These carry germs that can cause birth defects in the baby.  If you have low calcium intake from food, talk to your health care provider about whether you should take a daily calcium supplement.  Eat four or five small meals rather than three large meals a day.  Limit foods that are high in fat and processed sugars, such as fried and sweet foods.  To prevent constipation: ? Drink enough fluid to keep your urine clear or pale yellow. ? Eat foods that are high in fiber, such as fresh fruits and vegetables, whole grains, and beans. Activity  Exercise only as directed by your health care provider. Most women can continue their usual exercise routine during pregnancy. Try to exercise for 30 minutes at least 5 days a week. Stop exercising if you experience uterine contractions.  Avoid heavy lifting.  Do   not exercise in extreme heat or humidity, or at high altitudes.  Wear low-heel, comfortable shoes.  Practice good posture.  You may continue to have sex unless your health care provider tells you otherwise. Relieving pain and discomfort  Take frequent breaks and rest with your legs elevated if you have leg cramps or low back pain.  Take warm sitz baths to soothe any pain or discomfort caused by hemorrhoids. Use hemorrhoid cream if your health care provider approves.  Wear a good support bra to prevent discomfort from breast tenderness.  If you develop varicose veins: ? Wear support pantyhose or compression stockings as told by your healthcare provider. ? Elevate your feet for 15 minutes, 3-4 times a day. Prenatal care  Write down your questions. Take them to your prenatal visits.  Keep all your prenatal visits as told by your health care provider. This is important. Safety  Wear your seat belt at all times when driving.  Make  a list of emergency phone numbers, including numbers for family, friends, the hospital, and police and fire departments. General instructions  Avoid cat litter boxes and soil used by cats. These carry germs that can cause birth defects in the baby. If you have a cat, ask someone to clean the litter box for you.  Do not travel far distances unless it is absolutely necessary and only with the approval of your health care provider.  Do not use hot tubs, steam rooms, or saunas.  Do not drink alcohol.  Do not use any products that contain nicotine or tobacco, such as cigarettes and e-cigarettes. If you need help quitting, ask your health care provider.  Do not use any medicinal herbs or unprescribed drugs. These chemicals affect the formation and growth of the baby.  Do not douche or use tampons or scented sanitary pads.  Do not cross your legs for long periods of time.  To prepare for the arrival of your baby: ? Take prenatal classes to understand, practice, and ask questions about labor and delivery. ? Make a trial run to the hospital. ? Visit the hospital and tour the maternity area. ? Arrange for maternity or paternity leave through employers. ? Arrange for family and friends to take care of pets while you are in the hospital. ? Purchase a rear-facing car seat and make sure you know how to install it in your car. ? Pack your hospital bag. ? Prepare the baby's nursery. Make sure to remove all pillows and stuffed animals from the baby's crib to prevent suffocation.  Visit your dentist if you have not gone during your pregnancy. Use a soft toothbrush to brush your teeth and be gentle when you floss. Contact a health care provider if:  You are unsure if you are in labor or if your water has broken.  You become dizzy.  You have mild pelvic cramps, pelvic pressure, or nagging pain in your abdominal area.  You have lower back pain.  You have persistent nausea, vomiting, or  diarrhea.  You have an unusual or bad smelling vaginal discharge.  You have pain when you urinate. Get help right away if:  Your water breaks before 37 weeks.  You have regular contractions less than 5 minutes apart before 37 weeks.  You have a fever.  You are leaking fluid from your vagina.  You have spotting or bleeding from your vagina.  You have severe abdominal pain or cramping.  You have rapid weight loss or weight gain.  You have   shortness of breath with chest pain.  You notice sudden or extreme swelling of your face, hands, ankles, feet, or legs.  Your baby makes fewer than 10 movements in 2 hours.  You have severe headaches that do not go away when you take medicine.  You have vision changes. Summary  The third trimester is from week 28 through week 40, months 7 through 9. The third trimester is a time when the unborn baby (fetus) is growing rapidly.  During the third trimester, your discomfort may increase as you and your baby continue to gain weight. You may have abdominal, leg, and back pain, sleeping problems, and an increased need to urinate.  During the third trimester your breasts will keep growing and they will continue to become tender. A yellow fluid (colostrum) may leak from your breasts. This is the first milk you are producing for your baby.  False labor is a condition in which you feel small, irregular tightenings of the muscles in the womb (contractions) that eventually go away. These are called Braxton Hicks contractions. Contractions may last for hours, days, or even weeks before true labor sets in.  Signs of labor can include: abdominal cramps; regular contractions that start at 10 minutes apart and become stronger and more frequent with time; watery or bloody mucus discharge that comes from the vagina; increased pelvic pressure and dull back pain; and leaking of amniotic fluid. This information is not intended to replace advice given to you by your  health care provider. Make sure you discuss any questions you have with your health care provider. Document Revised: 05/23/2018 Document Reviewed: 03/07/2016 Elsevier Patient Education  2020 [employer]  

## 2019-05-05 ENCOUNTER — Ambulatory Visit (INDEPENDENT_AMBULATORY_CARE_PROVIDER_SITE_OTHER): Payer: 59 | Admitting: Obstetrics & Gynecology

## 2019-05-05 ENCOUNTER — Encounter: Payer: Self-pay | Admitting: Obstetrics & Gynecology

## 2019-05-05 ENCOUNTER — Other Ambulatory Visit: Payer: Self-pay

## 2019-05-05 VITALS — BP 125/70 | HR 97 | Wt 196.0 lb

## 2019-05-05 DIAGNOSIS — K649 Unspecified hemorrhoids: Secondary | ICD-10-CM

## 2019-05-05 DIAGNOSIS — O99613 Diseases of the digestive system complicating pregnancy, third trimester: Secondary | ICD-10-CM

## 2019-05-05 DIAGNOSIS — Z3400 Encounter for supervision of normal first pregnancy, unspecified trimester: Secondary | ICD-10-CM

## 2019-05-05 DIAGNOSIS — M5431 Sciatica, right side: Secondary | ICD-10-CM

## 2019-05-05 DIAGNOSIS — Z3403 Encounter for supervision of normal first pregnancy, third trimester: Secondary | ICD-10-CM

## 2019-05-05 DIAGNOSIS — Z113 Encounter for screening for infections with a predominantly sexual mode of transmission: Secondary | ICD-10-CM

## 2019-05-05 DIAGNOSIS — K219 Gastro-esophageal reflux disease without esophagitis: Secondary | ICD-10-CM

## 2019-05-05 DIAGNOSIS — Z789 Other specified health status: Secondary | ICD-10-CM

## 2019-05-05 NOTE — Patient Instructions (Signed)
Natural Childbirth Natural childbirth is when labor and delivery progresses naturally with minimal medical assistance or medicines. Natural childbirth may be an option if you have a low risk pregnancy. With the help of a birthing professional such as a midwife, doula, or other health care provider, you may be able to use relaxation techniques and controlled breathing to manage pain during labor. Many women choose natural childbirth because it makes them feel more in control and in touch with the experience of giving birth. Some women also choose natural childbirth because they are concerned that medicines may affect them and their babies. What types of natural childbirth techniques are available? There are two types of natural childbirth techniques, which are taught in classes:  The Lamaze method. In these classes, parents learn that having a baby is normal, healthy, and natural. Mothers are taught to take a neutral position regarding pain medicine and numbing medicines, and to make an informed decision about using these medicines if the time comes.  The Bradley method, also called husband-coached birth. In these classes, the father or partner learns to be the birth coach. The mother is encouraged to exercise and eat a balanced, nutritious diet. Both parents also learn relaxation and deep breathing exercises and are taught how to prepare for emergency situations. What are some natural pain and relaxation techniques? If you are considering a natural childbirth, you should explore your options for managing pain and discomfort during your labor and delivery. Some natural pain and relaxation techniques include:  Meditation.  Yoga.  Hypnosis.  Acupuncture.  Massage.  Changing positions, such as by walking, rocking, showering, or leaning on birth balls.  Lying in warm water or a whirlpool bath.  Finding an activity that keeps your mind off the labor pain.  Listening to soft music.  Focusing  on a particular object (visual imagery). How can I prepare for a natural birth?  Talk with your spouse or partner about your goals for having a natural childbirth.  Decide if you will have your baby in the hospital, at a birthing center, or at home.  Have your spouse or partner attend the natural childbirth technique classes with you.  Decide which type of health care provider you would like to assist you with your delivery.  If you have other children, make plans to have someone take care of them when you go to the hospital or birthing center.  Know the distance and the time it takes to go to the hospital or birthing center. Practice going there and time it to be sure.  Have a bag packed with a nightgown, bathrobe, and toiletries. Be ready to take it with you when you go into labor.  Keep phone numbers of your family and friends handy if you need to call someone when you go into labor.  Talk with your health care provider about the possibility of a medical emergency and what will happen if that occurs. What are the advantages and disadvantages of natural childbirth? Advantages  You are in control of your labor and delivery experience.  You may be able to avoid some common medical practices, such as getting medicines or being monitored all the time.  You and your spouse or partner will work together, which can increase your bond with each other.  In most delivery centers, your family and friends can be involved in the labor and delivery process. Disadvantages  The methods of helping relieve labor pains may not work for you.  You may feel   disappointed if you change your mind during labor and decide not to have a natural childbirth. What can I expect after delivery?  You may feel very tired.  You may feel uncomfortable as your uterus contracts.  The area around your vagina will feel sore.  You may feel cold and shaky. What questions should I ask my health care  provider?  Am I a good candidate for natural childbirth?  Can you refer me to classes to learn more about natural childbirth?  How do I create a birth plan that outlines my wishes for natural childbirth? Where to find more information  American Pregnancy Association: americanpregnancy.org  American Congress of Obstetricians and Gynecologists: acog.org  American College of Nurse-Midwives: www.midwife.org Summary  Natural childbirth may be an option if you have a low risk pregnancy.  The Bradley or Lamaze Methods are techniques that can assist you in achieving a natural birth experience.  Talk to your health care provider to determine if you are a good candidate for a natural childbirth. This information is not intended to replace advice given to you by your health care provider. Make sure you discuss any questions you have with your health care provider. Document Revised: 05/24/2018 Document Reviewed: 04/10/2016 Elsevier Patient Education  2020 Elsevier Inc.  

## 2019-05-05 NOTE — Progress Notes (Signed)
   PRENATAL VISIT NOTE  Subjective:  Karen Patel is a 29 y.o. G1P0 at 103w6d being seen today for ongoing prenatal care.  She is currently monitored for the following issues for this low-risk pregnancy and has Supervision of normal first pregnancy, antepartum; Sciatica of right side; Gastroesophageal reflux during pregnancy, antepartum; Language barrier affecting health care; and Hemorrhoids on their problem list.  Patient reports painful hemmorhoids. .  Contractions: Not present. Vag. Bleeding: None.  Movement: Present. Denies leaking of fluid.   The following portions of the patient's history were reviewed and updated as appropriate: allergies, current medications, past family history, past medical history, past social history, past surgical history and problem list.   Objective:   Vitals:   05/05/19 0940  BP: 125/70  Pulse: 97  Weight: 196 lb (88.9 kg)    Fetal Status:     Movement: Present     General:  Alert, oriented and cooperative. Patient is in no acute distress.  Skin: Skin is warm and dry. No rash noted.   Cardiovascular: Normal heart rate noted  Respiratory: Normal respiratory effort, no problems with respiration noted  Abdomen: Soft, gravid, appropriate for gestational age.  Pain/Pressure: Present     Pelvic: Cervical exam performed in the presence of a chaperone        Extremities: Normal range of motion.  Edema: None  Mental Status: Normal mood and affect. Normal behavior. Normal judgment and thought content.   Assessment and Plan:  Pregnancy: G1P0 at [redacted]w[redacted]d 1. Supervision of normal first pregnancy, antepartum  - Culture, beta strep (group b only) - GC/Chlamydia probe amp ([place])not at [facility]  2. Language barrier affecting health care Interpreter used for entire visit.   3. Sciatica of right side improved  4. Gastroesophageal reflux during pregnancy, antepartum  5. Hemorrhoids, unspecified hemorrhoid type Recommend Preparation H and hydrocortisone  mixed.    Preterm labor symptoms and general obstetric precautions including but not limited to vaginal bleeding, contractions, leaking of fluid and fetal movement were reviewed in detail with the patient. Please refer to After Visit Summary for other counseling recommendations.   Return in about 1 week (around 05/12/2019).  Future Appointments  Date Time Provider Department Center  05/13/2019 10:40 AM Aviva Signs, CNM [facility] None    Willodean Rosenthal, MD

## 2019-05-06 LAB — GC/CHLAMYDIA PROBE AMP (~~LOC~~) NOT AT ARMC
Chlamydia: NEGATIVE
Comment: NEGATIVE
Comment: NORMAL
Neisseria Gonorrhea: NEGATIVE

## 2019-05-08 LAB — CULTURE, BETA STREP (GROUP B ONLY): Strep Gp B Culture: POSITIVE — AB

## 2019-05-13 ENCOUNTER — Inpatient Hospital Stay (HOSPITAL_COMMUNITY)
Admission: AD | Admit: 2019-05-13 | Discharge: 2019-05-13 | Disposition: A | Discharge status: Home / Self Care | Payer: 59 | Attending: Obstetrics & Gynecology | Admitting: Obstetrics & Gynecology

## 2019-05-13 ENCOUNTER — Inpatient Hospital Stay (HOSPITAL_BASED_OUTPATIENT_CLINIC_OR_DEPARTMENT_OTHER): Payer: 59

## 2019-05-13 ENCOUNTER — Other Ambulatory Visit: Payer: Self-pay

## 2019-05-13 ENCOUNTER — Encounter: Payer: Self-pay | Admitting: Advanced Practice Midwife

## 2019-05-13 ENCOUNTER — Ambulatory Visit (INDEPENDENT_AMBULATORY_CARE_PROVIDER_SITE_OTHER): Payer: 59 | Admitting: Advanced Practice Midwife

## 2019-05-13 ENCOUNTER — Encounter (HOSPITAL_COMMUNITY): Payer: Self-pay | Admitting: Obstetrics & Gynecology

## 2019-05-13 VITALS — BP 107/70 | HR 92 | Wt 195.1 lb

## 2019-05-13 DIAGNOSIS — Z3689 Encounter for other specified antenatal screening: Secondary | ICD-10-CM | POA: Insufficient documentation

## 2019-05-13 DIAGNOSIS — Z3A37 37 weeks gestation of pregnancy: Secondary | ICD-10-CM

## 2019-05-13 DIAGNOSIS — O368130 Decreased fetal movements, third trimester, not applicable or unspecified: Secondary | ICD-10-CM

## 2019-05-13 DIAGNOSIS — Z79899 Other long term (current) drug therapy: Secondary | ICD-10-CM | POA: Diagnosis not present

## 2019-05-13 DIAGNOSIS — O288 Other abnormal findings on antenatal screening of mother: Secondary | ICD-10-CM

## 2019-05-13 DIAGNOSIS — Z603 Acculturation difficulty: Secondary | ICD-10-CM

## 2019-05-13 DIAGNOSIS — Z3400 Encounter for supervision of normal first pregnancy, unspecified trimester: Secondary | ICD-10-CM

## 2019-05-13 NOTE — Progress Notes (Signed)
Pt's husband interpreted for her

## 2019-05-13 NOTE — Progress Notes (Signed)
   PRENATAL VISIT NOTE  Subjective:  Karen Patel is a 29 y.o. G1P0 at [redacted]w[redacted]d being seen today for ongoing prenatal care.  She is currently monitored for the following issues for this low-risk pregnancy and has Supervision of normal first pregnancy, antepartum; Sciatica of right side; Gastroesophageal reflux during pregnancy, antepartum; Language barrier affecting health care; and Hemorrhoids on their problem list.  Patient reports decreased fetal movement today, occasional mild contractions.  Contractions: Irregular. Vag. Bleeding: None.  Movement: (!) Decreased. Denies leaking of fluid.   The following portions of the patient's history were reviewed and updated as appropriate: allergies, current medications, past family history, past medical history, past social history, past surgical history and problem list.   Objective:   Vitals:   05/13/19 1049  BP: 107/70  Pulse: 92  Weight: 195 lb 1.9 oz (88.5 kg)    Fetal Status: Fetal Heart Rate (bpm): 149 Fundal Height: 37 cm Movement: (!) Decreased     General:  Alert, oriented and cooperative. Patient is in no acute distress.  Skin: Skin is warm and dry. No rash noted.   Cardiovascular: Normal heart rate noted  Respiratory: Normal respiratory effort, no problems with respiration noted  Abdomen: Soft, gravid, appropriate for gestational age.  Pain/Pressure: Present     Pelvic: Cervical exam deferred        Extremities: Normal range of motion.  Edema: Trace  Mental Status: Normal mood and affect. Normal behavior. Normal judgment and thought content.   Assessment and Plan:  Pregnancy: G1P0 at [redacted]w[redacted]d 1. Supervision of normal first pregnancy, antepartum      NST done for decreased fetal movement      NST is overall nonreactive. There are small accels that do not meet criteria.  There are several small variable dips with contractions Interpretor used throughout visit Will send to [facility] for BPP (MFM could not take her).  Explained if  nonreassuring, may recommend IOL today  Term labor symptoms and general obstetric precautions including but not limited to vaginal bleeding, contractions, leaking of fluid and fetal movement were reviewed in detail with the patient. Please refer to After Visit Summary for other counseling recommendations.    Wynelle Bourgeois, CNM

## 2019-05-13 NOTE — [facility] Note (Signed)
.   Karen Patel is a 29 y.o. at [redacted]w[redacted]d here in [facility] reporting: she was sent from office for NR NST and a decrease in fetal movement. Denies any pain, LOF or VB. Pt has not had anything to eat today  Onset of complaint: today  Pain score: 0 Vitals:   05/13/19 1233  BP: 127/72  Pulse: 96  Resp: 16  Temp: 98.3 F (36.8 C)  SpO2: 100%     FHT:145 Lab orders placed from triage:

## 2019-05-13 NOTE — Discharge Instructions (Signed)
Fetal Movement Counts Patient Name: ________________________________________________ Patient Due Date: ____________________ What is a fetal movement count?  A fetal movement count is the number of times that you feel your baby move during a certain amount of time. This may also be called a fetal kick count. A fetal movement count is recommended for every pregnant woman. You may be asked to start counting fetal movements as early as week 28 of your pregnancy. Pay attention to when your baby is most active. You may notice your baby's sleep and wake cycles. You may also notice things that make your baby move more. You should do a fetal movement count:  When your baby is normally most active.  At the same time each day. A good time to count movements is while you are resting, after having something to eat and drink. How do I count fetal movements? 1. Find a quiet, comfortable area. Sit, or lie down on your side. 2. Write down the date, the start time and stop time, and the number of movements that you felt between those two times. Take this information with you to your health care visits. 3. Write down your start time when you feel the first movement. 4. Count kicks, flutters, swishes, rolls, and jabs. You should feel at least 10 movements. 5. You may stop counting after you have felt 10 movements, or if you have been counting for 2 hours. Write down the stop time. 6. If you do not feel 10 movements in 2 hours, contact your health care provider for further instructions. Your health care provider may want to do additional tests to assess your baby's well-being. Contact a health care provider if:  You feel fewer than 10 movements in 2 hours.  Your baby is not moving like he or she usually does. Date: ____________ Start time: ____________ Stop time: ____________ Movements: ____________ Date: ____________ Start time: ____________ Stop time: ____________ Movements: ____________ Date: ____________  Start time: ____________ Stop time: ____________ Movements: ____________ Date: ____________ Start time: ____________ Stop time: ____________ Movements: ____________ Date: ____________ Start time: ____________ Stop time: ____________ Movements: ____________ Date: ____________ Start time: ____________ Stop time: ____________ Movements: ____________ Date: ____________ Start time: ____________ Stop time: ____________ Movements: ____________ Date: ____________ Start time: ____________ Stop time: ____________ Movements: ____________ Date: ____________ Start time: ____________ Stop time: ____________ Movements: ____________ This information is not intended to replace advice given to you by your health care provider. Make sure you discuss any questions you have with your health care provider. Document Revised: 09/19/2018 Document Reviewed: 09/19/2018 Elsevier Patient Education  2020 Elsevier Inc.  

## 2019-05-13 NOTE — MAU Provider Note (Signed)
History   [id number]   Chief Complaint  Patient presents with  . Decreased Fetal Movement    HPI Karen Patel is a 29 y.o. female  G1P0 at [redacted]w[redacted]d here with report of decreased fetal movement since this morning. Was seen in the office this morning for her ob appointment (around 1030). At the time reported decreased movement but patient now states that it wasn't decreased, just not as strong as normal. States she has felt 10 movements since leaving the office & driving here. Denies abdominal pain, vaginal bleeding, or LOF. Sent here for BPP because her fetal tracing in the office was non reactive.    Patient's last menstrual period was 08/27/2018 (exact date).  OB History  Gravida Para Term Preterm AB Living  1            SAB TAB Ectopic Multiple Live Births               # Outcome Date GA Lbr Len/2nd Weight Sex Delivery Anes PTL Lv  1 Current             Past Medical History:  Diagnosis Date  . Medical history non-contributory     Family History  Problem Relation Age of Onset  . Diabetes Mother   . Hypertension Mother     Social History   Socioeconomic History  . Marital status: Married    Spouse name: Not on file  . Number of children: Not on file  . Years of education: Not on file  . Highest education level: Not on file  Occupational History  . Not on file  Tobacco Use  . Smoking status: Never Smoker  . Smokeless tobacco: Never Used  Substance and Sexual Activity  . Alcohol use: Never  . Drug use: Never  . Sexual activity: Not Currently    Birth control/protection: None  Other Topics Concern  . Not on file  Social History Narrative  . Not on file   Social Determinants of Health   Financial Resource Strain:   . Difficulty of Paying Living Expenses:   Food Insecurity:   . Worried About Programme researcher, broadcasting/film/video in the Last Year:   . Barista in the Last Year:   Transportation Needs:   . Freight forwarder (Medical):   Marland Kitchen Lack of Transportation  (Non-Medical):   Physical Activity:   . Days of Exercise per Week:   . Minutes of Exercise per Session:   Stress:   . Feeling of Stress :   Social Connections:   . Frequency of Communication with Friends and Family:   . Frequency of Social Gatherings with Friends and Family:   . Attends Religious Services:   . Active Member of Clubs or Organizations:   . Attends Banker Meetings:   Marland Kitchen Marital Status:     No Known Allergies  No current facility-administered medications on file prior to encounter.   Current Outpatient Medications on File Prior to Encounter  Medication Sig Dispense Refill  . ferrous sulfate 325 (65 FE) MG tablet Take 325 mg by mouth daily with breakfast.    . folic acid (FOLVITE) 400 MCG tablet Take 400 mcg by mouth daily.    . Multiple Vitamin (MULTIVITAMIN) tablet Take 1 tablet by mouth daily.    . Omega-3 Fatty Acids (FISH OIL CONCENTRATE) 300 MG CAPS Take by mouth.    . Prenatal Vit-Fe Fumarate-FA (PRENATAL VITAMIN) 27-0.8 MG TABS Take 1 capsule by mouth daily. 90  tablet 4     Review of Systems  Constitutional: Negative.   Gastrointestinal: Negative.   Genitourinary: Negative.      Physical Exam   Vitals:   05/13/19 1233  BP: 127/72  Pulse: 96  Resp: 16  Temp: 98.3 F (36.8 C)  SpO2: 100%    Physical Exam  Nursing note and vitals reviewed. Constitutional: She appears well-developed and well-nourished. No distress.  Respiratory: Effort normal. No respiratory distress.  Skin: She is not diaphoretic.  Psychiatric: She has a normal mood and affect. Her behavior is normal. Thought content normal.   NST:  Baseline: 150 bpm, Variability: Good {> 6 bpm), Accelerations: Reactive and Decelerations: Absent  MAU Course  Procedures  MDM Reactive NST BPP 8/8 with normal AFI Pt reports good movement  Will d/c home. Pt to keep f/u in office.   Assessment and Plan  A: 1. NST (non-stress test) reactive   2. [redacted] weeks gestation of  pregnancy   3. Decreased fetal movement, third trimester, not applicable or unspecified fetus    P: Discharge home Fetal kick counts Discussed reasons to return to MAU Keep f/u with OB   Jorje Guild, NP 05/13/2019 4:08 PM

## 2019-05-13 NOTE — Patient Instructions (Signed)
Biophysical Profile A biophysical profile is a non-invasive test that may be done during pregnancy to check that your developing baby (fetus) and your placenta are healthy. Your health care provider may recommend a biophysical profile if your pregnancy is at a higher risk for certain problems. A biophysical profile is usually done during the last 3 months of pregnancy (third trimester). A biophysical profile combines two tests to check the health of your baby. In one test, you will have a device strapped to your belly to measure your baby's heart rate. The other test involves using sound waves and a computer (ultrasound) to create an image of your baby inside your womb (uterus). Together, these tests tell your health care provider about the overall health of your baby. Tell a health care provider about:  Any allergies you have.  All medicines you are taking, including vitamins, herbs, eye drops, creams, and over-the-counter medicines.  Any medical conditions you have.  Any concerns you have about your pregnancy.  Any symptoms such as abdominal pain or contractions, nausea or vomiting, vaginal bleeding, leaking of amniotic fluid, decreased fetal movements, fever or infection, increased swelling, headaches, or visual disturbances.  How often you feel your baby move. What are the risks? There are no risks to you or your baby from a biophysical profile. What happens before the procedure? Ask your health care provider how to prepare.  You may need to drink fluids so that you have a full bladder for your ultrasound.  You may also need to eat before you arrive for the test. That makes your baby more active. What happens during the procedure?      You will lie on your back on an exam table.  Your blood pressure may be monitored during the procedure.  A belt will be placed around your belly. The belt has a sensor to measure your baby's heart rate.  You may have to wear another belt and  sensor to measure any muscle movements (contractions) in your uterus. During the ultrasound, a health care provider or technician will gently roll a handheld device (transducer) over your belly. This device sends signals to a computer that creates images of your baby.  Five areas of your baby's health and development will be checked during the biophysical profile: ? Heart rate. ? Breathing. ? Movement. ? Active muscle movement (muscle tone). ? The amount of fluid in your uterus (amniotic fluid). The procedure may vary among health care providers and hospitals. What happens after the procedure?  Your health care provider will discuss your results with you. The results of a biophysical profile are scored in a range of 0 to 10. Each area that is evaluated is given a score of 0 or 2 points. If you get a score of 6 or less, you may need further testing, or your baby may need to be delivered early. A score of 8 to 10 with normal amniotic fluid levels is considered normal.  Unless you need additional testing, you can go home right after the procedure and resume your normal activities. Summary  A biophysical profile is a non-invasive test that may be done during pregnancy to check that your developing baby (fetus) and your placenta are healthy.  A biophysical profile combines two tests: A test to measure your baby's heart rate and an ultrasound test to create an image of your baby in the womb.  Tell your health care provider about any concerns you have about your pregnancy or any pregnancy-related symptoms.  If you get a score of 6 or less, you may need further testing, or your baby may need to be delivered early. A score of 8 to 10 with normal amniotic fluid levels is considered normal. °This information is not intended to replace advice given to you by your health care provider. Make sure you discuss any questions you have with your health care provider. °Document Revised: 05/22/2018 Document  Reviewed: 03/03/2016 °Elsevier Patient Education © 2020 Elsevier Inc. ° °

## 2019-05-13 NOTE — Progress Notes (Signed)
[employer] language services interpreter Yesim [phone].

## 2019-05-21 ENCOUNTER — Other Ambulatory Visit: Payer: Self-pay

## 2019-05-21 ENCOUNTER — Ambulatory Visit (INDEPENDENT_AMBULATORY_CARE_PROVIDER_SITE_OTHER): Payer: 59 | Admitting: Obstetrics & Gynecology

## 2019-05-21 ENCOUNTER — Encounter: Payer: Self-pay | Admitting: Obstetrics & Gynecology

## 2019-05-21 VITALS — BP 109/71 | HR 110 | Wt 198.0 lb

## 2019-05-21 DIAGNOSIS — O99619 Diseases of the digestive system complicating pregnancy, unspecified trimester: Secondary | ICD-10-CM

## 2019-05-21 DIAGNOSIS — O26893 Other specified pregnancy related conditions, third trimester: Secondary | ICD-10-CM

## 2019-05-21 DIAGNOSIS — K219 Gastro-esophageal reflux disease without esophagitis: Secondary | ICD-10-CM

## 2019-05-21 DIAGNOSIS — Z3A38 38 weeks gestation of pregnancy: Secondary | ICD-10-CM

## 2019-05-21 DIAGNOSIS — Z789 Other specified health status: Secondary | ICD-10-CM

## 2019-05-21 DIAGNOSIS — Z603 Acculturation difficulty: Secondary | ICD-10-CM

## 2019-05-21 DIAGNOSIS — Z3400 Encounter for supervision of normal first pregnancy, unspecified trimester: Secondary | ICD-10-CM

## 2019-05-21 DIAGNOSIS — M5431 Sciatica, right side: Secondary | ICD-10-CM

## 2019-05-21 DIAGNOSIS — K648 Other hemorrhoids: Secondary | ICD-10-CM

## 2019-05-21 NOTE — Progress Notes (Signed)
   PRENATAL VISIT NOTE  Subjective:  Karen Patel is a 29 y.o. G1P0 at [redacted]w[redacted]d being seen today for ongoing prenatal care.  She is currently monitored for the following issues for this low-risk pregnancy and has Supervision of normal first pregnancy, antepartum; Sciatica of right side; Gastroesophageal reflux during pregnancy, antepartum; Language barrier affecting health care; and Hemorrhoids on their problem list.  Patient reports no complaints.  Contractions: Irregular. Vag. Bleeding: None.  Movement: Present. Denies leaking of fluid.   The following portions of the patient's history were reviewed and updated as appropriate: allergies, current medications, past family history, past medical history, past social history, past surgical history and problem list.   Objective:   Vitals:   05/21/19 1457  BP: 109/71  Pulse: (!) 110  Weight: 198 lb (89.8 kg)    Fetal Status: Fetal Heart Rate (bpm): 153 Fundal Height: 38 cm Movement: Present     General:  Alert, oriented and cooperative. Patient is in no acute distress.  Skin: Skin is warm and dry. No rash noted.   Cardiovascular: Normal heart rate noted  Respiratory: Normal respiratory effort, no problems with respiration noted  Abdomen: Soft, gravid, appropriate for gestational age.  Pain/Pressure: Present     Pelvic: Cervical exam deferred        Extremities: Normal range of motion.  Edema: None  Mental Status: Normal mood and affect. Normal behavior. Normal judgment and thought content.   Assessment and Plan:  Pregnancy: G1P0 at [redacted]w[redacted]d 1. Supervision of normal first pregnancy, antepartum FH and FHR WNL  2. Language barrier affecting health care Interpreter used for entire visit.   3. Sciatica of right side No pain presently.   Term labor symptoms and general obstetric precautions including but not limited to vaginal bleeding, contractions, leaking of fluid and fetal movement were reviewed in detail with the patient. Please refer  to After Visit Summary for other counseling recommendations.   Return in about 1 week (around 05/28/2019).  Future Appointments  Date Time Provider Department Center  05/27/2019 10:10 AM Aviva Signs, CNM [facility] None    Willodean Rosenthal, MD

## 2019-05-21 NOTE — Progress Notes (Signed)
[employer] language interpreter Maryam [phone].

## 2019-05-27 ENCOUNTER — Encounter: Payer: 59 | Admitting: Advanced Practice Midwife

## 2019-05-28 ENCOUNTER — Other Ambulatory Visit: Payer: Self-pay

## 2019-05-28 ENCOUNTER — Ambulatory Visit (INDEPENDENT_AMBULATORY_CARE_PROVIDER_SITE_OTHER): Payer: 59 | Admitting: Obstetrics & Gynecology

## 2019-05-28 VITALS — BP 112/75 | HR 102 | Wt 198.1 lb

## 2019-05-28 DIAGNOSIS — K219 Gastro-esophageal reflux disease without esophagitis: Secondary | ICD-10-CM

## 2019-05-28 DIAGNOSIS — Z603 Acculturation difficulty: Secondary | ICD-10-CM

## 2019-05-28 DIAGNOSIS — Z3A39 39 weeks gestation of pregnancy: Secondary | ICD-10-CM

## 2019-05-28 DIAGNOSIS — Z3400 Encounter for supervision of normal first pregnancy, unspecified trimester: Secondary | ICD-10-CM

## 2019-05-28 DIAGNOSIS — Z789 Other specified health status: Secondary | ICD-10-CM

## 2019-05-28 DIAGNOSIS — O99619 Diseases of the digestive system complicating pregnancy, unspecified trimester: Secondary | ICD-10-CM

## 2019-05-28 NOTE — Progress Notes (Signed)
[employer] language services interpreter Basak [phone].

## 2019-05-28 NOTE — Progress Notes (Signed)
   PRENATAL VISIT NOTE  Subjective:  Karen Patel is a 29 y.o. G1P0 at [redacted]w[redacted]d being seen today for ongoing prenatal care.  She is currently monitored for the following issues for this low-risk pregnancy and has Supervision of normal first pregnancy, antepartum; Sciatica of right side; Gastroesophageal reflux during pregnancy, antepartum; Language barrier affecting health care; and Hemorrhoids on their problem list.  Patient reports no complaints.  Contractions: Irregular. Vag. Bleeding: None.  Movement: Present. Denies leaking of fluid.   The following portions of the patient's history were reviewed and updated as appropriate: allergies, current medications, past family history, past medical history, past social history, past surgical history and problem list.   Objective:   Vitals:   05/28/19 1540  BP: 112/75  Pulse: (!) 102  Weight: 198 lb 1.9 oz (89.9 kg)    Fetal Status:     Movement: Present     General:  Alert, oriented and cooperative. Patient is in no acute distress.  Skin: Skin is warm and dry. No rash noted.   Cardiovascular: Normal heart rate noted  Respiratory: Normal respiratory effort, no problems with respiration noted  Abdomen: Soft, gravid, appropriate for gestational age.  Pain/Pressure: Present     Pelvic: Cervical exam performed in the presence of a chaperone        Extremities: Normal range of motion.  Edema: Trace  Mental Status: Normal mood and affect. Normal behavior. Normal judgment and thought content.   Assessment and Plan:  Pregnancy: G1P0 at [redacted]w[redacted]d 1. Supervision of normal first pregnancy, antepartum FH and FHR wnl Will schueldd for IOL at next visit if undel  2. Language barrier affecting health care [place] interpreter used   3. Gastroesophageal reflux during pregnancy, antepartum  Term labor symptoms and general obstetric precautions including but not limited to vaginal bleeding, contractions, leaking of fluid and fetal movement were reviewed in  detail with the patient. Please refer to After Visit Summary for other counseling recommendations.   Return in about 1 week (around 06/04/2019).  No future appointments.  Willodean Rosenthal, MD

## 2019-06-03 ENCOUNTER — Encounter: Payer: Self-pay | Admitting: Advanced Practice Midwife

## 2019-06-03 ENCOUNTER — Ambulatory Visit (INDEPENDENT_AMBULATORY_CARE_PROVIDER_SITE_OTHER): Payer: 59 | Admitting: Advanced Practice Midwife

## 2019-06-03 ENCOUNTER — Other Ambulatory Visit: Payer: Self-pay

## 2019-06-03 VITALS — BP 120/74 | HR 91 | Wt 200.0 lb

## 2019-06-03 DIAGNOSIS — Z3A40 40 weeks gestation of pregnancy: Secondary | ICD-10-CM

## 2019-06-03 DIAGNOSIS — Z603 Acculturation difficulty: Secondary | ICD-10-CM

## 2019-06-03 DIAGNOSIS — Z789 Other specified health status: Secondary | ICD-10-CM

## 2019-06-03 DIAGNOSIS — Z3400 Encounter for supervision of normal first pregnancy, unspecified trimester: Secondary | ICD-10-CM

## 2019-06-03 DIAGNOSIS — Z3403 Encounter for supervision of normal first pregnancy, third trimester: Secondary | ICD-10-CM

## 2019-06-03 NOTE — Progress Notes (Signed)
   PRENATAL VISIT NOTE  Subjective:  Karen Patel is a 29 y.o. G1P0 at [redacted]w[redacted]d being seen today for ongoing prenatal care.  She is currently monitored for the following issues for this low-risk pregnancy and has Supervision of normal first pregnancy, antepartum; Sciatica of right side; Gastroesophageal reflux during pregnancy, antepartum; Language barrier affecting health care; and Hemorrhoids on their problem list.  Patient reports occasional contractions.  Contractions: Irregular. Vag. Bleeding: None.  Movement: Present. Denies leaking of fluid.   The following portions of the patient's history were reviewed and updated as appropriate: allergies, current medications, past family history, past medical history, past social history, past surgical history and problem list.   Objective:   Vitals:   06/03/19 0951  BP: 120/74  Pulse: 91  Weight: 200 lb (90.7 kg)    Fetal Status: Fetal Heart Rate (bpm): 140   Movement: Present     General:  Alert, oriented and cooperative. Patient is in no acute distress.  Skin: Skin is warm and dry. No rash noted.   Cardiovascular: Normal heart rate noted  Respiratory: Normal respiratory effort, no problems with respiration noted  Abdomen: Soft, gravid, appropriate for gestational age.  Pain/Pressure: Present     Pelvic: Cervical exam performed in the presence of a chaperone       Cervix 2cm/70-80%/-2-3/vtx  Extremities: Normal range of motion.  Edema: Trace  Mental Status: Normal mood and affect. Normal behavior. Normal judgment and thought content.   Assessment and Plan:  Pregnancy: G1P0 at [redacted]w[redacted]d  Scheduled for IOL next week Form completed and routed to Labor and Delivery Video Interpretor used  Term labor symptoms and general obstetric precautions including but not limited to vaginal bleeding, contractions, leaking of fluid and fetal movement were reviewed in detail with the patient. Please refer to After Visit Summary for other counseling  recommendations.   No follow-ups on file.  No future appointments.  Wynelle Bourgeois, CNM

## 2019-06-03 NOTE — Patient Instructions (Signed)

## 2019-06-03 NOTE — Progress Notes (Signed)
Induction Assessment Scheduling Form: Fax to [facility]:  [phone] Route to [facility] Labor Delivery   Speaks [place]                                                                                   DOB:  02-Dec-1990                                                            MRN:  [id number]  Phone:  Home Phone [phone]  Mobile [phone]    Provider:  [facility] ([facility]ulty Practice)  GP:  G1P0                                                            Estimated Date of Delivery: 06/03/19  Dating Criteria: [place] at 8 weeks    Medical Indications for induction:  PostDates Admission Date/Time:  06/10/19 (flexible) Gestational age on admission:  34   Filed Weights   06/03/19 0951  Weight: 200 lb (90.7 kg)   HIV:  Non Reactive (01/26 0910) GBS: Positive/-- (03/22 1018)     Method of induction(proposed):  Foley - Cx 1-2/60%   Recheck today was 2/70-80%/-3-2/vtx   Scheduling Provider Signature:  Wynelle Bourgeois, [place]                                            Today's Date:  06/03/2019

## 2019-06-04 ENCOUNTER — Other Ambulatory Visit: Payer: Self-pay | Admitting: Advanced Practice Midwife

## 2019-06-05 ENCOUNTER — Telehealth (HOSPITAL_COMMUNITY): Payer: Self-pay | Admitting: *Deleted

## 2019-06-05 NOTE — Addendum Note (Signed)
Addended by: Aviva Signs on: 06/05/2019 12:57 AM   Modules accepted: Orders, SmartSet

## 2019-06-05 NOTE — Telephone Encounter (Signed)
Preadmission screen  Interpreter number [phone]

## 2019-06-06 ENCOUNTER — Telehealth (HOSPITAL_COMMUNITY): Payer: Self-pay | Admitting: *Deleted

## 2019-06-06 ENCOUNTER — Encounter (HOSPITAL_COMMUNITY): Payer: Self-pay | Admitting: *Deleted

## 2019-06-06 NOTE — Telephone Encounter (Signed)
Preadmission screen Interpreter number [phone]

## 2019-06-09 ENCOUNTER — Other Ambulatory Visit (HOSPITAL_COMMUNITY)
Admission: RE | Admit: 2019-06-09 | Discharge: 2019-06-09 | Disposition: A | Discharge status: Home / Self Care | Payer: 59 | Source: Ambulatory Visit | Attending: Family Medicine | Admitting: Family Medicine

## 2019-06-09 DIAGNOSIS — Z20822 Contact with and (suspected) exposure to covid-19: Secondary | ICD-10-CM | POA: Insufficient documentation

## 2019-06-09 DIAGNOSIS — Z01812 Encounter for preprocedural laboratory examination: Secondary | ICD-10-CM | POA: Insufficient documentation

## 2019-06-09 LAB — SARS CORONAVIRUS 2 (TAT 6-24 HRS): SARS Coronavirus 2: NEGATIVE

## 2019-06-10 ENCOUNTER — Inpatient Hospital Stay (HOSPITAL_COMMUNITY)
Admission: AD | Admit: 2019-06-10 | Discharge: 2019-06-12 | DRG: 768 | Disposition: A | Discharge status: Home / Self Care | Payer: 59 | Attending: Family Medicine | Admitting: Family Medicine

## 2019-06-10 ENCOUNTER — Inpatient Hospital Stay (HOSPITAL_COMMUNITY): Payer: 59

## 2019-06-10 ENCOUNTER — Encounter (HOSPITAL_COMMUNITY): Payer: Self-pay | Admitting: Family Medicine

## 2019-06-10 DIAGNOSIS — Z20822 Contact with and (suspected) exposure to covid-19: Secondary | ICD-10-CM | POA: Diagnosis present

## 2019-06-10 DIAGNOSIS — Z789 Other specified health status: Secondary | ICD-10-CM

## 2019-06-10 DIAGNOSIS — O99824 Streptococcus B carrier state complicating childbirth: Secondary | ICD-10-CM | POA: Diagnosis present

## 2019-06-10 DIAGNOSIS — K649 Unspecified hemorrhoids: Secondary | ICD-10-CM | POA: Diagnosis present

## 2019-06-10 DIAGNOSIS — O2243 Hemorrhoids in pregnancy, third trimester: Secondary | ICD-10-CM | POA: Diagnosis present

## 2019-06-10 DIAGNOSIS — K219 Gastro-esophageal reflux disease without esophagitis: Secondary | ICD-10-CM

## 2019-06-10 DIAGNOSIS — Z3A41 41 weeks gestation of pregnancy: Secondary | ICD-10-CM

## 2019-06-10 DIAGNOSIS — O480 Post-term pregnancy: Secondary | ICD-10-CM | POA: Diagnosis present

## 2019-06-10 DIAGNOSIS — Z3400 Encounter for supervision of normal first pregnancy, unspecified trimester: Secondary | ICD-10-CM

## 2019-06-10 DIAGNOSIS — Z8759 Personal history of other complications of pregnancy, childbirth and the puerperium: Secondary | ICD-10-CM

## 2019-06-10 DIAGNOSIS — M5431 Sciatica, right side: Secondary | ICD-10-CM

## 2019-06-10 DIAGNOSIS — O99619 Diseases of the digestive system complicating pregnancy, unspecified trimester: Secondary | ICD-10-CM

## 2019-06-10 LAB — CBC
HCT: 36.7 % (ref 36.0–46.0)
Hemoglobin: 12.2 g/dL (ref 12.0–15.0)
MCH: 30.1 pg (ref 26.0–34.0)
MCHC: 33.2 g/dL (ref 30.0–36.0)
MCV: 90.6 fL (ref 80.0–100.0)
Platelets: 217 10*3/uL (ref 150–400)
RBC: 4.05 MIL/uL (ref 3.87–5.11)
RDW: 13.2 % (ref 11.5–15.5)
WBC: 9.2 10*3/uL (ref 4.0–10.5)
nRBC: 0 % (ref 0.0–0.2)

## 2019-06-10 LAB — TYPE AND SCREEN
ABO/RH(D): A POS
Antibody Screen: NEGATIVE

## 2019-06-10 LAB — ABO/RH: ABO/RH(D): A POS

## 2019-06-10 MED ORDER — PENICILLIN G POT IN DEXTROSE 60000 UNIT/ML IV SOLN
3.0000 10*6.[IU] | INTRAVENOUS | Status: DC
  Administered 2019-06-10 – 2019-06-11 (×3): 3 10*6.[IU] via INTRAVENOUS
  Filled 2019-06-10 (×3): qty 50

## 2019-06-10 MED ORDER — LACTATED RINGERS IV SOLN
500.0000 mL | INTRAVENOUS | Status: DC | PRN
  Administered 2019-06-10 – 2019-06-11 (×2): 500 mL via INTRAVENOUS

## 2019-06-10 MED ORDER — SODIUM CHLORIDE 0.9 % IV SOLN
5.0000 10*6.[IU] | Freq: Once | INTRAVENOUS | Status: AC
  Administered 2019-06-10: 5 10*6.[IU] via INTRAVENOUS
  Filled 2019-06-10: qty 5

## 2019-06-10 MED ORDER — FENTANYL CITRATE (PF) 100 MCG/2ML IJ SOLN
INTRAMUSCULAR | Status: AC
  Administered 2019-06-10: 100 ug via INTRAVENOUS
  Filled 2019-06-10: qty 2

## 2019-06-10 MED ORDER — LIDOCAINE HCL (PF) 1 % IJ SOLN
30.0000 mL | INTRAMUSCULAR | Status: AC | PRN
  Administered 2019-06-11: 30 mL via SUBCUTANEOUS
  Filled 2019-06-10: qty 30

## 2019-06-10 MED ORDER — OXYTOCIN 40 UNITS IN NORMAL SALINE INFUSION - SIMPLE MED
2.5000 [IU]/h | INTRAVENOUS | Status: DC
  Administered 2019-06-11: 2.5 [IU]/h via INTRAVENOUS

## 2019-06-10 MED ORDER — OXYCODONE-ACETAMINOPHEN 5-325 MG PO TABS: 2.0000 | ORAL_TABLET | ORAL | Status: DC | PRN

## 2019-06-10 MED ORDER — SOD CITRATE-CITRIC ACID 500-334 MG/5ML PO SOLN
30.0000 mL | ORAL | Status: DC | PRN
  Administered 2019-06-11: 30 mL via ORAL
  Filled 2019-06-10: qty 30

## 2019-06-10 MED ORDER — MISOPROSTOL 50MCG HALF TABLET
50.0000 ug | ORAL_TABLET | ORAL | Status: DC
  Filled 2019-06-10: qty 1

## 2019-06-10 MED ORDER — FENTANYL CITRATE (PF) 100 MCG/2ML IJ SOLN
100.0000 ug | INTRAMUSCULAR | Status: DC | PRN
  Administered 2019-06-11: 100 ug via INTRAVENOUS
  Filled 2019-06-10: qty 2

## 2019-06-10 MED ORDER — OXYTOCIN BOLUS FROM INFUSION
500.0000 mL | Freq: Once | INTRAVENOUS | Status: AC
  Administered 2019-06-11: 500 mL via INTRAVENOUS

## 2019-06-10 MED ORDER — TERBUTALINE SULFATE 1 MG/ML IJ SOLN: 0.2500 mg | Freq: Once | INTRAMUSCULAR | Status: DC | PRN

## 2019-06-10 MED ORDER — ONDANSETRON HCL 4 MG/2ML IJ SOLN
4.0000 mg | Freq: Four times a day (QID) | INTRAMUSCULAR | Status: DC | PRN
  Administered 2019-06-11: 4 mg via INTRAVENOUS
  Filled 2019-06-10: qty 2

## 2019-06-10 MED ORDER — LACTATED RINGERS IV SOLN: INTRAVENOUS | Status: DC

## 2019-06-10 MED ORDER — ACETAMINOPHEN 325 MG PO TABS: 650.0000 mg | ORAL_TABLET | ORAL | Status: DC | PRN

## 2019-06-10 MED ORDER — OXYCODONE-ACETAMINOPHEN 5-325 MG PO TABS: 1.0000 | ORAL_TABLET | ORAL | Status: DC | PRN

## 2019-06-10 MED ORDER — OXYTOCIN 40 UNITS IN NORMAL SALINE INFUSION - SIMPLE MED
1.0000 m[IU]/min | INTRAVENOUS | Status: DC
  Administered 2019-06-10: 2 m[IU]/min via INTRAVENOUS
  Filled 2019-06-10: qty 1000

## 2019-06-10 NOTE — H&P (Addendum)
OBSTETRIC ADMISSION HISTORY AND PHYSICAL  Karen Patel is a 29 y.o. female G1P0 with IUP at [redacted]w[redacted]d by LMP presenting for IOL for post dates. She reports +FMs, No LOF, no VB, no blurry vision, headaches or peripheral edema, and RUQ pain.  She plans on breast feeding. She is undecided for birth control. She received her prenatal care at [facility]  Dating: By LMP --->  Estimated Date of Delivery: 06/03/19  Sono:   @[redacted]w[redacted]d , CWD, normal anatomy, EFW 1300gm (2 lb 14 oz)      61%lie  Prenatal History/Complications:  Pyelectasis of fetus on prenatal ultrasound (39mm) Echogenic intracardiac focus of the heart. Per patient "triple screen" for Down syndrome performed in [place] was normal  Past Medical History: Past Medical History:  Diagnosis Date  . Medical history non-contributory     Past Surgical History: Past Surgical History:  Procedure Laterality Date  . NO PAST SURGERIES      Obstetrical History: OB History    Gravida  1   Para      Term      Preterm      AB      Living        SAB      TAB      Ectopic      Multiple      Live Births              Social History Social History   Socioeconomic History  . Marital status: Married    Spouse name: Not on file  . Number of children: Not on file  . Years of education: Not on file  . Highest education level: Not on file  Occupational History  . Not on file  Tobacco Use  . Smoking status: Never Smoker  . Smokeless tobacco: Never Used  Substance and Sexual Activity  . Alcohol use: Never  . Drug use: Never  . Sexual activity: Not Currently    Birth control/protection: None  Other Topics Concern  . Not on file  Social History Narrative  . Not on file   Social Determinants of Health   Financial Resource Strain:   . Difficulty of Paying Living Expenses:   Food Insecurity:   . Worried About Charity fundraiser in the Last Year:   . Arboriculturist in the Last Year:   Transportation Needs:   . Lexicographer (Medical):   Marland Kitchen Lack of Transportation (Non-Medical):   Physical Activity:   . Days of Exercise per Week:   . Minutes of Exercise per Session:   Stress:   . Feeling of Stress :   Social Connections:   . Frequency of Communication with Friends and Family:   . Frequency of Social Gatherings with Friends and Family:   . Attends Religious Services:   . Active Member of Clubs or Organizations:   . Attends Archivist Meetings:   Marland Kitchen Marital Status:     Family History: Family History  Problem Relation Age of Onset  . Diabetes Mother   . Hypertension Mother     Allergies: No Known Allergies  Medications Prior to Admission  Medication Sig Dispense Refill Last Dose  . ferrous sulfate 325 (65 FE) MG tablet Take 325 mg by mouth daily with breakfast.     . folic acid (FOLVITE) 213 MCG tablet Take 400 mcg by mouth daily.     . Multiple Vitamin (MULTIVITAMIN) tablet Take 1 tablet by mouth daily.     Marland Kitchen  Omega-3 Fatty Acids (FISH OIL CONCENTRATE) 300 MG CAPS Take by mouth.     . Prenatal Vit-Fe Fumarate-FA (PRENATAL VITAMIN) 27-0.8 MG TABS Take 1 capsule by mouth daily. 90 tablet 4      Review of Systems   All systems reviewed and negative except as stated in HPI  Last menstrual period 08/27/2018. General appearance: alert and no distress Lungs: clear to auscultation bilaterally Heart: regular rate and rhythm Abdomen: soft, non-tender; bowel sounds normal Pelvic: gravid uterus  Extremities: Homans sign is negative, no sign of DVT  Fetal monitoringBaseline: 145 bpm, Variability: Good {> 6 bpm), Accelerations: occ 10x10  and Decelerations: Absent Uterine activityFrequency: Every 2-3 minutes     Prenatal labs: ABO, Rh: A/Positive/-- (09/10 1455) Antibody: Negative (09/10 1455) Rubella: 23.50 (09/10 1455) RPR: Non Reactive (01/26 0910)  HBsAg: Negative (09/10 1455)  HIV: Non Reactive (01/26 0910)  GBS: Positive/-- (03/22 1018)  2 hr Glucola (75, 141,  103) Genetic screening:  Low risk female Anatomy US: bilateral urinary tract dilation   Prenatal Transfer Tool  Maternal Diabetes: No Genetic Screening: Normal Maternal Ultrasounds/Referrals: Isolated EIF (echogenic intracardiac focus) and Fetal renal pyelectasis Fetal Ultrasounds or other Referrals:  None Maternal Substance Abuse:  No Significant Maternal Medications:  None Significant Maternal Lab Results: Group B Strep positive  No results found for this or any previous visit (from the past 24 hour(s)).  Patient Active Problem List   Diagnosis Date Noted  . Post term pregnancy at [redacted] weeks gestation 06/10/2019  . Hemorrhoids 04/08/2019  . Language barrier affecting health care 03/27/2019  . Sciatica of right side 01/22/2019  . Gastroesophageal reflux during pregnancy, antepartum 01/22/2019  . Supervision of normal first pregnancy, antepartum 10/24/2018    Assessment/Plan:  Karen Patel is a 29 y.o. G1P0 at [redacted]w[redacted]d here for IOL for post dates.   #Labor:Admit to L&D. Foley bulb placed, start low dose pitocin. Discussed risks and benefits of labor induction. Reassess in 3-4 hours.  #Pain: Per patient request  #FWB: Cat I #ID:  GBS positive, PCN prophylaxis ordered  #MOF: breast #MOC: undecided #Circ:  No, plans to do in [place]   Vondra Brimage, DO  06/10/2019, 3:22 PM   OB FELLOW ATTESTATION  I have seen and examined this patient and edited the above documentation in the resident's note to reflect any changes or updates.  Zack Seal, MD/[facility] OB Fellow  06/10/2019, 8:03 PM

## 2019-06-10 NOTE — Progress Notes (Signed)
Labor Progress Note Karen Patel is a 29 y.o. G1P0 at [redacted]w[redacted]d presented for IOL for postdates. Spoke with patient using husband as interpreter.  S: Small amount of pain. Does not feel contractions.  O:  BP 123/68   Pulse 82   Temp 98.2 F (36.8 C) (Oral)   Resp 17   Ht 5\' 10"  (1.778 m)   Wt 92.6 kg   LMP 08/27/2018 (Exact Date)   BMI 29.29 kg/m  EFM: 145 bpm, moderate variability, no accels, no decels  CVE: Dilation: 5 Effacement (%): 80 Station: 0 Presentation: Vertex Exam by:: [email or web address] RNC   A&P: 29 y.o. G1P0 [redacted]w[redacted]d for IOL for postdates #Labor: s/p FB insertion, cont pitocin at this time. Consider AROM if unchanged at next check.  #Pain: Epidural when pt requests #FWB: Cat 1, EFW 61% #GBS positive, PCN started at 4/27 1638 hrs  5/27, DO 9:14 PM

## 2019-06-11 ENCOUNTER — Encounter (HOSPITAL_COMMUNITY): Payer: Self-pay | Admitting: Family Medicine

## 2019-06-11 ENCOUNTER — Inpatient Hospital Stay (HOSPITAL_COMMUNITY): Payer: 59 | Admitting: Anesthesiology

## 2019-06-11 DIAGNOSIS — O99824 Streptococcus B carrier state complicating childbirth: Secondary | ICD-10-CM

## 2019-06-11 DIAGNOSIS — Z3A41 41 weeks gestation of pregnancy: Secondary | ICD-10-CM

## 2019-06-11 LAB — RPR: RPR Ser Ql: NONREACTIVE

## 2019-06-11 MED ORDER — SODIUM CHLORIDE 0.9 % IV SOLN
2.0000 g | INTRAVENOUS | Status: AC
  Administered 2019-06-11: 2 g via INTRAVENOUS
  Filled 2019-06-11: qty 2

## 2019-06-11 MED ORDER — ONDANSETRON HCL 4 MG/2ML IJ SOLN: 4.0000 mg | INTRAMUSCULAR | Status: DC | PRN

## 2019-06-11 MED ORDER — POLYETHYLENE GLYCOL 3350 17 G PO PACK
17.0000 g | PACK | Freq: Two times a day (BID) | ORAL | Status: DC
  Administered 2019-06-11: 17 g via ORAL
  Filled 2019-06-11: qty 1

## 2019-06-11 MED ORDER — LIDOCAINE HCL (PF) 1 % IJ SOLN
INTRAMUSCULAR | Status: DC | PRN
  Administered 2019-06-11: 5 mL via EPIDURAL

## 2019-06-11 MED ORDER — PHENYLEPHRINE 40 MCG/ML (10ML) SYRINGE FOR IV PUSH (FOR BLOOD PRESSURE SUPPORT): 80.0000 ug | PREFILLED_SYRINGE | INTRAVENOUS | Status: DC | PRN

## 2019-06-11 MED ORDER — FENTANYL-BUPIVACAINE-NACL 0.5-0.125-0.9 MG/250ML-% EP SOLN
EPIDURAL | Status: AC
  Filled 2019-06-11: qty 250

## 2019-06-11 MED ORDER — OXYCODONE HCL 5 MG PO TABS: 5.0000 mg | ORAL_TABLET | ORAL | Status: DC | PRN

## 2019-06-11 MED ORDER — ACETAMINOPHEN 325 MG PO TABS: 650.0000 mg | ORAL_TABLET | Freq: Four times a day (QID) | ORAL | Status: DC | PRN

## 2019-06-11 MED ORDER — SENNOSIDES-DOCUSATE SODIUM 8.6-50 MG PO TABS
2.0000 | ORAL_TABLET | ORAL | Status: DC
  Administered 2019-06-11: 2 via ORAL
  Filled 2019-06-11: qty 2

## 2019-06-11 MED ORDER — WITCH HAZEL-GLYCERIN EX PADS
1.0000 "application " | MEDICATED_PAD | CUTANEOUS | Status: DC | PRN
  Administered 2019-06-11: 1 via TOPICAL

## 2019-06-11 MED ORDER — PRENATAL MULTIVITAMIN CH
1.0000 | ORAL_TABLET | Freq: Every day | ORAL | Status: DC
  Administered 2019-06-11 – 2019-06-12 (×2): 1 via ORAL
  Filled 2019-06-11 (×2): qty 1

## 2019-06-11 MED ORDER — TRANEXAMIC ACID-NACL 1000-0.7 MG/100ML-% IV SOLN: 1000.0000 mg | INTRAVENOUS | Status: DC

## 2019-06-11 MED ORDER — SODIUM CHLORIDE (PF) 0.9 % IJ SOLN
INTRAMUSCULAR | Status: DC | PRN
  Administered 2019-06-11: 12 mL/h via EPIDURAL

## 2019-06-11 MED ORDER — SIMETHICONE 80 MG PO CHEW: 80.0000 mg | CHEWABLE_TABLET | ORAL | Status: DC | PRN

## 2019-06-11 MED ORDER — BENZOCAINE-MENTHOL 20-0.5 % EX AERO
1.0000 "application " | INHALATION_SPRAY | CUTANEOUS | Status: DC | PRN
  Administered 2019-06-11: 1 via TOPICAL
  Filled 2019-06-11: qty 56

## 2019-06-11 MED ORDER — COCONUT OIL OIL: 1.0000 "application " | TOPICAL_OIL | Status: DC | PRN

## 2019-06-11 MED ORDER — ONDANSETRON HCL 4 MG PO TABS: 4.0000 mg | ORAL_TABLET | ORAL | Status: DC | PRN

## 2019-06-11 MED ORDER — EPHEDRINE 5 MG/ML INJ: 10.0000 mg | INTRAVENOUS | Status: DC | PRN

## 2019-06-11 MED ORDER — LACTATED RINGERS IV SOLN: 500.0000 mL | Freq: Once | INTRAVENOUS | Status: DC

## 2019-06-11 MED ORDER — TRANEXAMIC ACID-NACL 1000-0.7 MG/100ML-% IV SOLN
INTRAVENOUS | Status: AC
  Administered 2019-06-11: 1000 mg
  Filled 2019-06-11: qty 100

## 2019-06-11 MED ORDER — DIPHENHYDRAMINE HCL 50 MG/ML IJ SOLN: 12.5000 mg | INTRAMUSCULAR | Status: DC | PRN

## 2019-06-11 MED ORDER — DIPHENHYDRAMINE HCL 25 MG PO CAPS: 25.0000 mg | ORAL_CAPSULE | Freq: Four times a day (QID) | ORAL | Status: DC | PRN

## 2019-06-11 MED ORDER — TETANUS-DIPHTH-ACELL PERTUSSIS 5-2.5-18.5 LF-MCG/0.5 IM SUSP: 0.5000 mL | Freq: Once | INTRAMUSCULAR | Status: DC

## 2019-06-11 MED ORDER — MEASLES, MUMPS & RUBELLA VAC IJ SOLR: 0.5000 mL | Freq: Once | INTRAMUSCULAR | Status: DC

## 2019-06-11 MED ORDER — IBUPROFEN 600 MG PO TABS
600.0000 mg | ORAL_TABLET | Freq: Three times a day (TID) | ORAL | Status: DC | PRN
  Administered 2019-06-11 – 2019-06-12 (×4): 600 mg via ORAL
  Filled 2019-06-11 (×4): qty 1

## 2019-06-11 MED ORDER — DIBUCAINE (PERIANAL) 1 % EX OINT
1.0000 "application " | TOPICAL_OINTMENT | CUTANEOUS | Status: DC | PRN
  Administered 2019-06-11: 1 via RECTAL
  Filled 2019-06-11: qty 28

## 2019-06-11 MED ORDER — FENTANYL-BUPIVACAINE-NACL 0.5-0.125-0.9 MG/250ML-% EP SOLN: 12.0000 mL/h | EPIDURAL | Status: DC | PRN

## 2019-06-11 NOTE — Anesthesia Procedure Notes (Signed)
Epidural Patient location during procedure: OB Start time: 06/11/2019 12:38 AM End time: 06/11/2019 12:55 AM  Staffing Anesthesiologist: Trevor Iha, MD Performed: anesthesiologist   Preanesthetic Checklist Completed: patient identified, IV checked, site marked, risks and benefits discussed, surgical consent, monitors and equipment checked, pre-op evaluation and timeout performed  Epidural Patient position: sitting Prep: DuraPrep and site prepped and draped Patient monitoring: continuous pulse ox and blood pressure Approach: midline Location: L3-L4 Injection technique: LOR air  Needle:  Needle type: Tuohy  Needle gauge: 17 G Needle length: 9 cm and 9 Needle insertion depth: 7 cm Catheter type: closed end flexible Catheter size: 19 Gauge Catheter at skin depth: 13 cm Test dose: negative  Assessment Events: blood not aspirated, injection not painful, no injection resistance, no paresthesia and negative IV test  Additional Notes Patient identified. Risks/Benefits/Options discussed with patient including but not limited to bleeding, infection, nerve damage, paralysis, failed block, incomplete pain control, headache, blood pressure changes, nausea, vomiting, reactions to medication both or allergic, itching and postpartum back pain. Confirmed with bedside nurse the patient's most recent platelet count. Confirmed with patient that they are not currently taking any anticoagulation, have any bleeding history or any family history of bleeding disorders. Patient expressed understanding and wished to proceed. All questions were answered. Sterile technique was used throughout the entire procedure. Please see nursing notes for vital signs. Test dose was given through epidural needle and negative prior to continuing to dose epidural or start infusion. Warning signs of high block given to the patient including shortness of breath, tingling/numbness in hands, complete motor block, or any  concerning symptoms with instructions to call for help. Patient was given instructions on fall risk and not to get out of bed. All questions and concerns addressed with instructions to call with any issues.  1 Attempt (S) . Patient tolerated procedure well.

## 2019-06-11 NOTE — Anesthesia Preprocedure Evaluation (Addendum)
Anesthesia Evaluation  Patient identified by MRN, date of birth, ID band Patient awake    Reviewed: Allergy & Precautions, NPO status , Patient's Chart, lab work & pertinent test results  Airway Mallampati: II  TM Distance: >3 FB Neck ROM: Full    Dental no notable dental hx. (+) Teeth Intact   Pulmonary neg pulmonary ROS,    Pulmonary exam normal breath sounds clear to auscultation       Cardiovascular negative cardio ROS Normal cardiovascular exam Rhythm:Regular Rate:Normal     Neuro/Psych negative neurological ROS  negative psych ROS   GI/Hepatic Neg liver ROS, GERD  ,  Endo/Other  negative endocrine ROS  Renal/GU negative Renal ROS     Musculoskeletal negative musculoskeletal ROS (+)   Abdominal   Peds  Hematology Lab Results      Component                Value               Date                      WBC                      9.2                 06/10/2019                HGB                      12.2                06/10/2019                HCT                      36.7                06/10/2019                MCV                      90.6                06/10/2019                PLT                      217                 06/10/2019             Anesthesia Other Findings   Reproductive/Obstetrics (+) Pregnancy                             Anesthesia Physical Anesthesia Plan  ASA: II  Anesthesia Plan: Epidural   Post-op Pain Management:    Induction:   PONV Risk Score and Plan:   Airway Management Planned:   Additional Equipment:   Intra-op Plan:   Post-operative Plan:   Informed Consent: I have reviewed the patients History and Physical, chart, labs and discussed the procedure including the risks, benefits and alternatives for the proposed anesthesia with the patient or authorized representative who has indicated his/her understanding and acceptance.       Plan  Discussed with:   Anesthesia Plan  Comments: (41.1wk G1P0 for LEA )        Anesthesia Quick Evaluation

## 2019-06-11 NOTE — Lactation Note (Addendum)
This note was copied from a baby's chart. Lactation Consultation Note  Patient Name: Karen Patel YQIHK'V Date: 06/11/2019 Reason for consult: Initial assessment;Primapara;Term  [facility] student completed an initial consult with Karen Patel, a 28 yr. Old P1 who gave birth vaginally to a female infant at [redacted]w[redacted]d. Baby "Karen Patel" was born 14 hours ago and weighs 8lb 11.9 oz. Upon LC entrance the lights were dimmed and Karen Patel was asleep in MOB's arms. FOB was present and served as the Nurse, learning disability.   FOB reported that they currently do not have a breast pump, but he will call insurance tomorrow. MOB stated that she noticed that her areolas did get darker, but did not notice them get bigger. [facility] student offered to teach hand expression - MOB was agreeable. MOB has smaller breast tissue and everted nipples. Drops of colostrum noted. [facility] student attempted to feed colostrum to Karen Patel, but he was very resistant to opening his mouth while sleeping. FOB shared that Karen Patel fed approximately an hour prior to [facility] entrance and they would like to let him rest. LC student encouraged MOB to continue to hand express and feed Karen Patel and expressed milk via gloved finger or spoon.   MOB shared that she is experiencing some nipple soreness, reviewed prevention and treatment for sore nipples. [facility] student assisted with rubbing colostrum on MOB's nipples and provided comfort gels. LC student also encouraged MOB to ensure that Karen Patel has a deep latch while feeding.   LC reviewed breastfeeding basics, output expectations, newborn behavior, and outpatient resources/services. MOB and FOB stated that they had no further questions or concerns, but know to call out to Lactation/RN if needed.   Feeding Plan: Place Karen Patel STS frequently  Allow Karen Patel to feed at the breast 8-12x in 24 hours - paying attention to feeding cues.  Hand express often and provide Karen Patel with any EMB via gloved finger/spoon   Maternal Data Formula Feeding for Exclusion: No Has patient  been taught Hand Expression?: Yes Does the patient have breastfeeding experience prior to this delivery?: No  Feeding Feeding Type: Breast Fed   Interventions Interventions: Breast feeding basics reviewed;Breast massage;Hand express;Comfort gels  Lactation Tools Discussed/Used     Consult Status Consult Status: Follow-up Date: 06/12/19 Follow-up type: In-patient    Karen Patel 06/11/2019, 10:00 PM

## 2019-06-11 NOTE — Discharge Summary (Addendum)
Postpartum Discharge Summary      Patient Name: Karen Patel DOB: 17-Mar-1990 MRN: [id number]  Date of admission: 06/10/2019 Delivering Provider: Chauncey Mann   Date of discharge: 06/12/2019  Admitting diagnosis: Personal history of previous postdates pregnancy [Z87.59] Intrauterine pregnancy: [redacted]w[redacted]d    Secondary diagnosis:  Active Problems:   Supervision of normal first pregnancy, antepartum   Language barrier affecting health care   Hemorrhoids   Post term pregnancy at [redacted] weeks gestation   Personal history of previous postdates pregnancy   Type 3b perineal laceration  Additional problems: None     Discharge diagnosis: Term Pregnancy Delivered                                                                                                Post partum procedures:None  Augmentation: AROM, Pitocin and Foley Balloon  Complications: None  Hospital course:  Induction of Labor With Vaginal Delivery   29y.o. yo G1P0 at 432w1das admitted to the hospital 06/10/2019 for induction of labor.  Indication for induction: Postdates.  Patient had an uncomplicated labor course as follows: Initial SVE: 1.5/60/-1. Patient received Foley bulb, Pitocin and AROM. Received epidural. She then progressed to complete.  Membrane Rupture Time/Date: 1:47 AM ,06/11/2019   Intrapartum Procedures: Episiotomy: None [1]                                         Lacerations:  3rd degree [4]  Patient had delivery of a Viable infant.  Information for the patient's newborn:  YuMaleaha, Hughett0[[id number]]    06/11/2019  Details of delivery can be found in separate delivery note. 3b perineal laceration. Patient was started on bowel regimen and f/u check requested. Patient had a routine postpartum course. Patient is discharged home 06/12/19. Delivery time: 7:43 AM    Magnesium Sulfate received: No BMZ received: No Rhophylac:No MMR:No Transfusion:No  Physical exam  Vitals:   06/11/19 1515 06/11/19 1906  06/11/19 2302 06/12/19 0500  BP: 105/79 114/69 108/71 99/79  Pulse: (!) 103 92 99 94  Resp: '18 18 18 18  ' Temp: 98.4 F (36.9 C) 98.2 F (36.8 C) 97.8 F (36.6 C) 97.9 F (36.6 C)  TempSrc: Oral Oral Oral Oral  SpO2: 100% 100% 100% 100%  Weight:      Height:       General: alert, cooperative and appears stated age Lo22appropriate Uterine Fundus: firm Incision: NA DVT Evaluation: No evidence of DVT seen on physical exam Taken from Dr. FaRoyston Cowperrogress not on the day of discharge.    Labs: Lab Results  Component Value Date   WBC 9.2 06/10/2019   HGB 12.2 06/10/2019   HCT 36.7 06/10/2019   MCV 90.6 06/10/2019   PLT 217 06/10/2019   No flowsheet data found. Edinburgh Score: Edinburgh Postnatal Depression Scale Screening Tool 06/11/2019  I have been able to laugh and see the funny side of things. 0  I have looked forward with enjoyment to  things. 0  I have blamed myself unnecessarily when things went wrong. 0  I have been anxious or worried for no good reason. 1  I have felt scared or panicky for no good reason. 1  Things have been getting on top of me. 0  I have been so unhappy that I have had difficulty sleeping. 0  I have felt sad or miserable. 0  I have been so unhappy that I have been crying. 0  The thought of harming myself has occurred to me. 0  Edinburgh Postnatal Depression Scale Total 2    Discharge instruction: per After Visit Summary and "Baby and Me Booklet".  After visit meds:  Allergies as of 06/12/2019   No Known Allergies     Medication List    TAKE these medications   ferrous sulfate 325 (65 FE) MG tablet Take 325 mg by mouth daily with breakfast.   Fish Oil Concentrate 300 MG Caps Take 1 capsule by mouth once a week.   Gaviscon 95-358 MG/15ML Susp Generic drug: aluminum hydroxide-magnesium carbonate Take 30 mLs by mouth 3 (three) times daily as needed for indigestion or heartburn.   Prenatal Vitamin 27-0.8 MG Tabs Take 1 capsule by  mouth daily.   VITAMIN D3 PO Take 6 drops by mouth every other day.       Diet: routine diet  Activity: Advance as tolerated. Pelvic rest for 6 weeks.   Outpatient follow up:4 weeks Follow up Appt: Future Appointments  Date Time Provider [place]  07/18/2019 10:00 AM Lavonia Drafts, MD [facility] None   Follow up Visit: [facility] [place] Follow up.   Specialty: Obstetrics and Gynecology Contact information: [address] [place] [address] [phone]            Please schedule this patient for Postpartum visit in: 4 weeks with the following provider: Any provider Virtual For C/S patients schedule nurse incision check in weeks 2 weeks: no Low risk pregnancy complicated by: none Delivery mode:  SVD Anticipated Birth Control:  other/unsure PP Procedures needed: 3b perineal lac check  Schedule Integrated BH visit: no     Newborn Data: Live born female  Birth Weight: 3966g   APGAR: 74, 9  Newborn Delivery   Birth date/time: 06/11/2019 07:43:00 Delivery type: Vaginal, Spontaneous      Baby Feeding: Breast Disposition:home with mother   06/12/2019 Chauncey Mann, MD

## 2019-06-11 NOTE — Progress Notes (Signed)
Labor Progress Note Karen Patel is a 29 y.o. G1P0 at [redacted]w[redacted]d presented for IOL for postdates  S: Feeling less pain s/p epidural, now feeling shoulder pain but contraction pressure is much less  O:  BP 103/65   Pulse 80   Temp 97.9 F (36.6 C) (Oral)   Resp 16   Ht 5\' 10"  (1.778 m)   Wt 92.6 kg   LMP 08/27/2018 (Exact Date)   SpO2 99%   BMI 29.29 kg/m  EFM: 140bpm, moderate variability, no accels, no decels  CVE: Dilation: 5 Effacement (%): 80 Station: -2 Presentation: Vertex Exam by:: Dr. [email or web address]. Fair   A&P: 29 y.o. G1P0 [redacted]w[redacted]d here IOL for postdates #Labor: s/p FB, pitocin started at 1725, s/p epidural. AROM at 0130 with clear fluid with some blood. Will cont pitocin and anticipate SVD. Contractions showing up well. #Pain: s/p epidural #FWB: Cat 1, EFW 61% #GBS positive, PCN  [redacted]w[redacted]d, DO 1:58 AM

## 2019-06-11 NOTE — Progress Notes (Signed)
Labor Progress Note Karen Patel is a 30 y.o. G1P0 at [redacted]w[redacted]d presented for PD IOL. S: comfortable with epidural  O:  BP 98/63   Pulse 72   Temp 97.9 F (36.6 C) (Oral)   Resp 17   Ht 5\' 10"  (1.778 m)   Wt 92.6 kg   LMP 08/27/2018 (Exact Date)   SpO2 99%   BMI 29.29 kg/m  EFM: 145, moderate variability, pos accels, few variable decels, reactive TOCO: q33m  CVE: Dilation: 9 Effacement (%): 100 Cervical Position: Anterior Station: 0 Presentation: Vertex Exam by:: Adir Schicker   A&P: 29 y.o. G1P0 [redacted]w[redacted]d here for PD IOL. #Labor: Progressing well. S/p FB, Pit and AROM. Anticipate SVD. #Pain: epidural #FWB: Cat II; reassuring for moderate variability and accels #GBS positive; PCN  [redacted]w[redacted]d, MD 5:05 AM

## 2019-06-12 ENCOUNTER — Other Ambulatory Visit: Payer: Self-pay | Admitting: Family Medicine

## 2019-06-12 MED ORDER — POLYETHYLENE GLYCOL 3350 17 G PO PACK: 17.0000 g | PACK | Freq: Two times a day (BID) | ORAL | 0 refills | Status: DC

## 2019-06-12 MED ORDER — SENNA 8.6 MG PO TABS: 1.0000 | ORAL_TABLET | Freq: Two times a day (BID) | ORAL | 0 refills | Status: DC

## 2019-06-12 NOTE — Progress Notes (Signed)
Patient was discharged by resident without bowel regimen s/p 3b tear. Prescribed Miralax and Senokot.  Future Appointments  Date Time Provider Department Center  07/18/2019 10:00 AM Willodean Rosenthal, MD [facility] None   Jerilynn Birkenhead, MD [facility] Family Medicine Fellow, [facility], [facility]

## 2019-06-12 NOTE — Lactation Note (Signed)
This note was copied from a baby's chart. Lactation Consultation Note  Patient Name: Karen Patel YFVCB'S Date: 06/12/2019 Reason for consult: Follow-up assessment  P1 mother whose infant is now 1 hours old.  This is a tem baby at 41+1 weeks.   Family speaks [place], however, father does not wish to use an interpreter.  He will assist mother with interpretation.  Parents had some questions regarding feeding frequency and volumes.  Reviewed their concerns and encouraged to continue feeding 8-12 times/24 hours or sooner if baby shows feeding cues.  Mother is familiar with hand expression and I encouraged her to continue practicing before/after feedings.  Mother has some nipple soreness.  Coconut oil provided with instructions for use.  Suggested she also use EBM and she has comfort gels at bedside.  Engorgement prevention/treatment reviewed.  Manual pump provided and instructions given.  #24 flange size is appropriate at this time.  Father has not had the opportunity to call the insurance company yet.  They will obtain a DEBP from their insurance company.  Mother has our OP phone number for questions after discharge.   Maternal Data    Feeding    LATCH Score                   Interventions    Lactation Tools Discussed/Used     Consult Status Consult Status: Complete Date: 06/12/19 Follow-up type: Call as needed    Karen Patel R Karen Patel 06/12/2019, 1:07 PM

## 2019-06-12 NOTE — Anesthesia Postprocedure Evaluation (Signed)
Anesthesia Post Note  Patient: Karen Patel  Procedure(s) Performed: AN AD HOC LABOR EPIDURAL     Patient location during evaluation: Mother Baby Anesthesia Type: Epidural Level of consciousness: awake and alert, patient cooperative and oriented Pain management: pain level controlled Vital Signs Assessment: post-procedure vital signs reviewed and stable Respiratory status: spontaneous breathing, nonlabored ventilation and respiratory function stable Cardiovascular status: stable Postop Assessment: no headache, no backache and epidural receding Anesthetic complications: no    Last Vitals:  Vitals:   06/11/19 2302 06/12/19 0500  BP: 108/71 99/79  Pulse: 99 94  Resp: 18 18  Temp: 36.6 C 36.6 C  SpO2: 100% 100%    Last Pain:  Vitals:   06/12/19 0500  TempSrc: Oral  PainSc: 5    Pain Goal:                   [place]

## 2019-06-12 NOTE — Progress Notes (Signed)
Post Partum Day 1 Subjective: Patient reports feeling well. She is tolerating PO. Ambulating and urinating without difficulty. Lochia minimal. Denies pain at laceration site, does report some pain at epidural site. Also reports some hemorrhoid pain.  Objective: Blood pressure 99/79, pulse 94, temperature 97.9 F (36.6 C), temperature source Oral, resp. rate 18, height 5\' 10"  (1.778 m), weight 92.6 kg, last menstrual period 08/27/2018, SpO2 100 %, unknown if currently breastfeeding.  Physical Exam:  General: alert, cooperative and appears stated age Lochia: appropriate Uterine Fundus: firm Incision: NA DVT Evaluation: No evidence of DVT seen on physical exam.  Recent Labs    06/10/19 1542  HGB 12.2  HCT 36.7    Assessment/Plan: Plan for discharge tomorrow. Okay to discharge today if baby can; RN to page team for orders. Cont bowel regimen. Appt requested for check of 3b tear in 1-2 weeks. Breastfeeding Declines contraception Vitals stable    LOS: 2 days   06/12/19 06/12/2019, 8:14 AM

## 2019-06-12 NOTE — Discharge Instructions (Signed)

## 2019-06-13 MED FILL — SM SENNA LAXATIVE 8.6 MG TA: 8.6 | 50 days supply | Qty: 100 | Fill #0

## 2019-06-13 MED FILL — SM CLEARLAX POWDER: 17 | 9 days supply | Qty: 238 | Fill #0

## 2019-06-16 MED FILL — OMEPRAZOLE 20 MG CAP: 20 | 30 days supply | Qty: 30 | Fill #1

## 2019-06-16 MED FILL — PRENATAL VITAMIN PLUS LOW I: 27-1 | 30 days supply | Qty: 30 | Fill #4

## 2019-06-26 ENCOUNTER — Other Ambulatory Visit: Payer: Self-pay

## 2019-06-26 ENCOUNTER — Encounter: Payer: Self-pay | Admitting: Obstetrics & Gynecology

## 2019-06-26 ENCOUNTER — Ambulatory Visit (INDEPENDENT_AMBULATORY_CARE_PROVIDER_SITE_OTHER): Payer: 59 | Admitting: Obstetrics & Gynecology

## 2019-06-26 DIAGNOSIS — K649 Unspecified hemorrhoids: Secondary | ICD-10-CM

## 2019-06-26 MED ORDER — HYDROCORTISONE ACETATE 25 MG RE SUPP: 25.0000 mg | Freq: Two times a day (BID) | RECTAL | 3 refills | Status: DC

## 2019-06-26 NOTE — Patient Instructions (Signed)

## 2019-06-26 NOTE — Progress Notes (Signed)
History:  29 y.o. G1P1001 here today for PP check of 3rd degree laceration.  Pt reports min pain from the laceration but, lots of pain from the hemorrhoids.   The following portions of the patient's history were reviewed and updated as appropriate: allergies, current medications, past family history, past medical history, past social history, past surgical history and problem list.  Review of Systems:  Pertinent items are noted in HPI.    Objective:  Physical Exam Blood pressure 120/73, pulse 72, weight 181 lb (82.1 kg), last menstrual period 08/27/2018, currently breastfeeding.  CONSTITUTIONAL: Well-developed, well-nourished female in no acute distress.  HENT:  Normocephalic, atraumatic EYES: Conjunctivae and EOM are normal. No scleral icterus.  NECK: Normal range of motion SKIN: Skin is warm and dry. No rash noted. Not diaphoretic.No pallor. NEUROLGIC: Alert and oriented to person, place, and time. Normal coordination.  Abd: Soft, nontender and nondistended Pelvic: Normal appearing external genitalia;The perineum is healing without difficulty. The RV septum is intact. There are hemorrhoids that are non thrombosed but inflamed.  Normal discharge.  Small uterus, no other palpable masses, no uterine or adnexal tenderness  Assessment & Plan:  Diagnoses and all orders for this visit:  Postpartum state  Hemorrhoids, unspecified hemorrhoid type -     hydrocortisone (ANUSOL-HC) 25 MG suppository; Place 1 suppository (25 mg total) rectally 2 (two) times daily.  f/u in 4 weeks or sooner prn Keep [place]   Nyron Mozer L. Harraway-Smith, M.D., [facility]

## 2019-06-26 NOTE — Progress Notes (Signed)
Patient presents for evaluation of 3rd degree laceration. Patient is 2 weeks postpartum- breastfeeding. Armandina Stammer RN

## 2019-07-17 MED FILL — PRENATAL 27-1 MG TABS: 27-1 | 30 days supply | Qty: 30 | Fill #5

## 2019-07-18 ENCOUNTER — Ambulatory Visit (INDEPENDENT_AMBULATORY_CARE_PROVIDER_SITE_OTHER): Payer: 59 | Admitting: Obstetrics & Gynecology

## 2019-07-18 ENCOUNTER — Other Ambulatory Visit: Payer: Self-pay

## 2019-07-18 ENCOUNTER — Encounter: Payer: Self-pay | Admitting: Obstetrics & Gynecology

## 2019-07-18 DIAGNOSIS — Z1332 Encounter for screening for maternal depression: Secondary | ICD-10-CM

## 2019-07-18 NOTE — Progress Notes (Signed)
    Post Partum Visit Note  Karen Patel is a 29 y.o. G68P1001 female who presents for a postpartum visit. She is 5 weeks postpartum following a normal spontaneous vaginal delivery.  I have fully reviewed the prenatal and intrapartum course. The delivery was at 41 gestational weeks.  Anesthesia: epidural and lidocaine . Postpartum course has been uncomplicated. Baby is doing well. Baby is feeding by breast. Bleeding staining only. Bowel function is normal. Bladder function is normal. Patient is not sexually active. Contraception method is none. Postpartum depression screening: negative.  The following portions of the patient's history were reviewed and updated as appropriate: allergies, current medications, past family history, past medical history, past social history, past surgical history and problem list.  Review of Systems Pertinent items are noted in HPI.    Objective:  Last menstrual period 08/27/2018, currently breastfeeding. BP 104/62   Pulse 75   Wt 178 lb (80.7 kg)   LMP 08/27/2018 (Exact Date)   BMI 25.54 kg/m   CONSTITUTIONAL: Well-developed, well-nourished female in no acute distress.  HENT:  Normocephalic, atraumatic EYES: Conjunctivae and EOM are normal. No scleral icterus.  NECK: Normal range of motion SKIN: Skin is warm and dry. No rash noted. Not diaphoretic.No pallor. NEUROLGIC: Alert and oriented to person, place, and time. Normal coordination.  GU: EGBUS: no lesions; perineum intact Vagina: no blood in vault Cervix: no lesion; no mucopurulent d/c Uterus: small, mobile Adnexa: no masses; non tender    Assessment:    6 weeks postpartum exam. Pap smear not done at today's visit.   Plan:   Essential components of care per ACOG recommendations:  1.  Mood and well being: Patient with negative depression screening today. Reviewed local resources for support.  - Patient does not use tobacco.   2. Infant care and feeding:  -Patient currently breastmilk  feeding? Yes Reviewed importance of draining breast regularly to support lactation. -Social determinants of health (SDOH) reviewed in EPIC. No concerns  3. Sexuality, contraception and birth spacing - Patient does not want a pregnancy in the next year.  Patient desired condoms today.   - Discussed birth spacing of 18 months  4. Sleep and fatigue -Encouraged family/partner/community support of 4 hrs of uninterrupted sleep to help with mood and fatigue  5. Physical Recovery  - Discussed patients delivery and complications - Patient had a 3rd degree laceration, perineal healing reviewed. Patient expressed understanding - Patient has urinary incontinence? No Patient is safe to resume physical and sexual activity in 2 weeks  6.  Health Maintenance - Last pap smear done 10/2018 and was normal with negative HPV.  Nandi Tonnesen L. Harraway-Smith, M.D., [facility]

## 2019-08-27 MED FILL — PRENATAL 27-1 MG TABS: 27-1 | 30 days supply | Qty: 30 | Fill #6

## 2019-09-08 ENCOUNTER — Ambulatory Visit: Payer: 59 | Admitting: Family

## 2019-09-08 DIAGNOSIS — Z0289 Encounter for other administrative examinations: Secondary | ICD-10-CM

## 2019-09-26 MED FILL — PRENATAL 27-1 MG TABS: 27-1 | 30 days supply | Qty: 30 | Fill #7

## 2019-10-30 ENCOUNTER — Other Ambulatory Visit: Payer: Self-pay

## 2019-10-30 MED ORDER — PRENATAL VITAMIN 27-0.8 MG PO TABS: 1.0000 | ORAL_TABLET | Freq: Every day | ORAL | 4 refills | Status: DC

## 2019-10-30 MED FILL — PRENATAL 27-1 MG TABS: 27-1 | 30 days supply | Qty: 30 | Fill #0

## 2020-03-23 ENCOUNTER — Ambulatory Visit (INDEPENDENT_AMBULATORY_CARE_PROVIDER_SITE_OTHER): Payer: 59

## 2020-03-23 ENCOUNTER — Other Ambulatory Visit: Payer: Self-pay

## 2020-03-23 DIAGNOSIS — N912 Amenorrhea, unspecified: Secondary | ICD-10-CM

## 2020-03-23 NOTE — Progress Notes (Signed)
Patient sent to lab for  HCG. Shauniece Kwan RN °

## 2020-03-24 ENCOUNTER — Telehealth: Payer: Self-pay

## 2020-03-24 LAB — BETA HCG QUANT (REF LAB): hCG Quant: <1 m[IU]/mL

## 2020-03-24 NOTE — Telephone Encounter (Signed)
-----   Message from Levie Heritage, DO sent at 03/24/2020  8:37 AM EST ----- Negative serum HCG

## 2020-03-24 NOTE — Telephone Encounter (Signed)
Patient called and voicemailbox is full. Sent my chart message. Armandina Stammer RN

## 2021-08-24 IMAGING — US US MFM FETAL BPP W/O NON-STRESS
1 series · 15 of 28 positions shown · non-contrast
Comparison: none

[Series 1: us mfm fetal bpp w/o non-stress · 34 acquisitions, 15 frames shown]
[im 1/34]
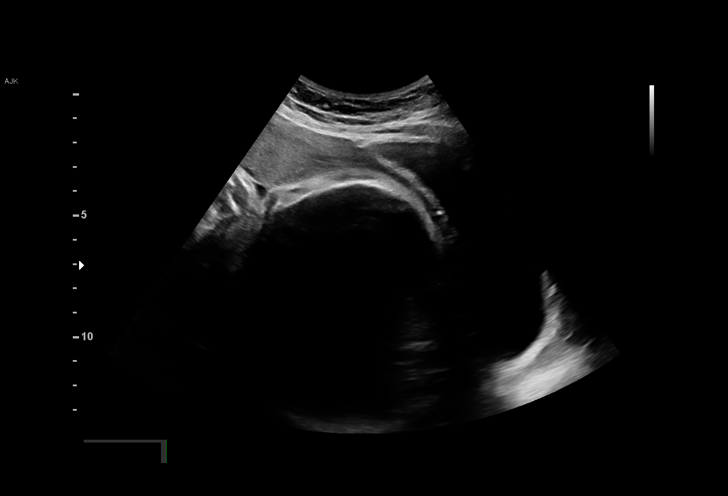
[im 3/34]
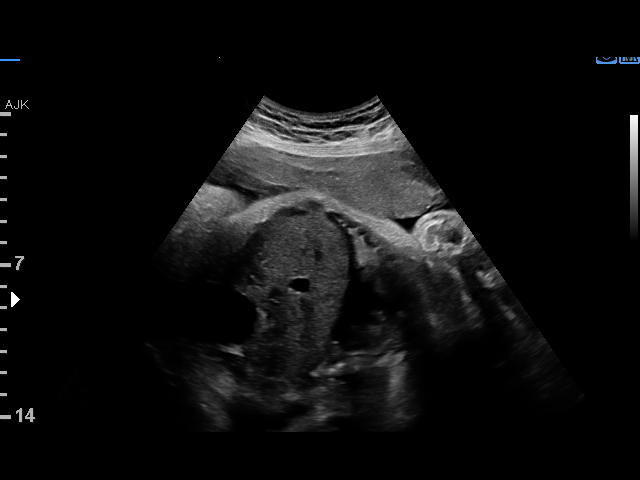
[im 5/34]
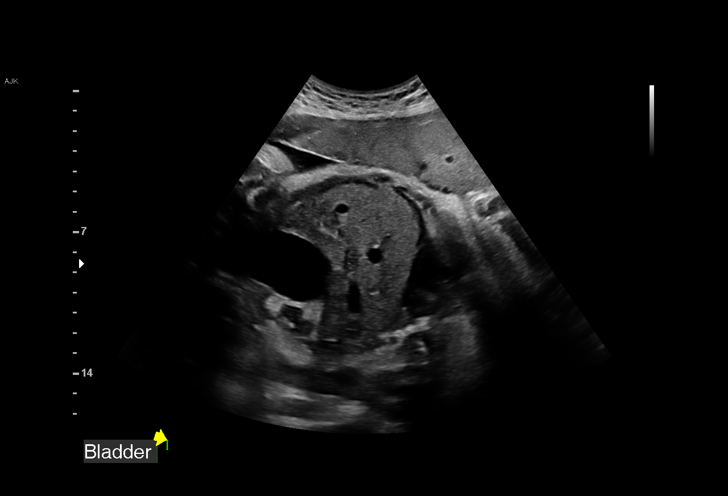
[im 8/34]
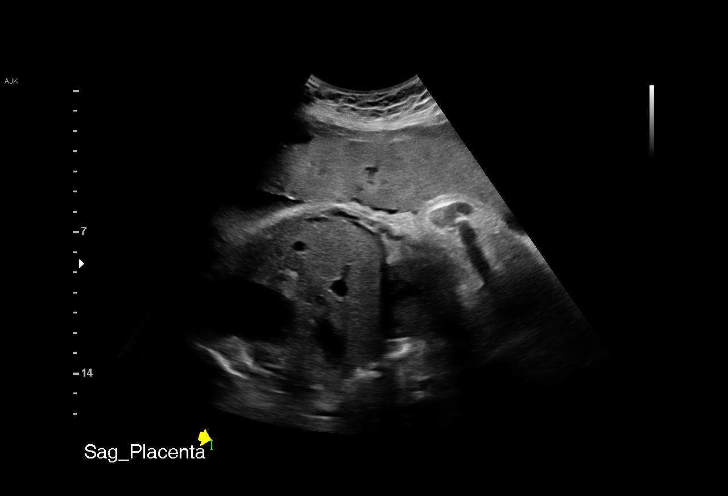
[im 10/34]
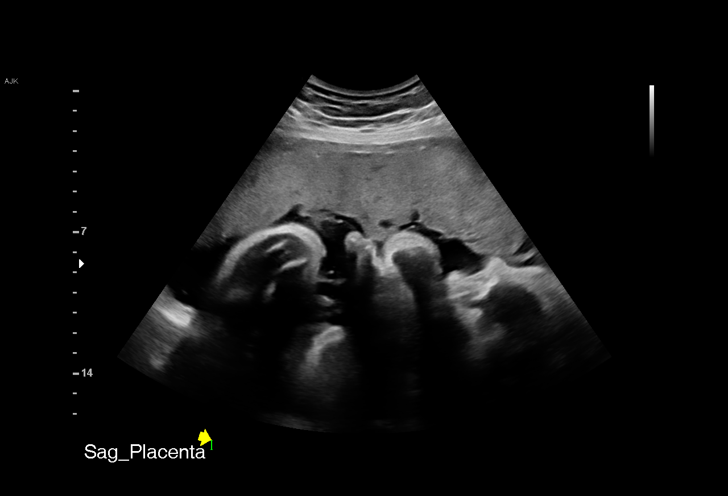
[im 13/34]
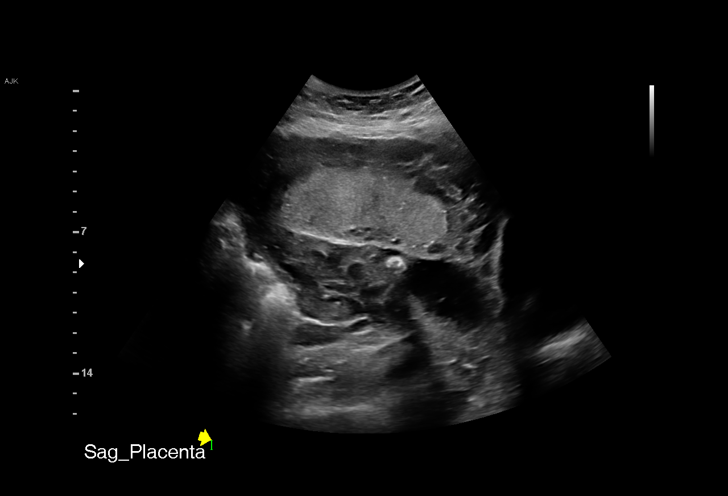
[im 15/34]
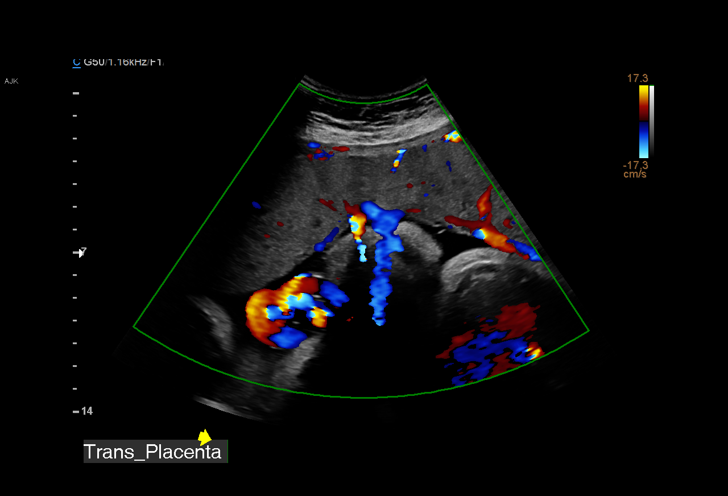
[im 18/34]
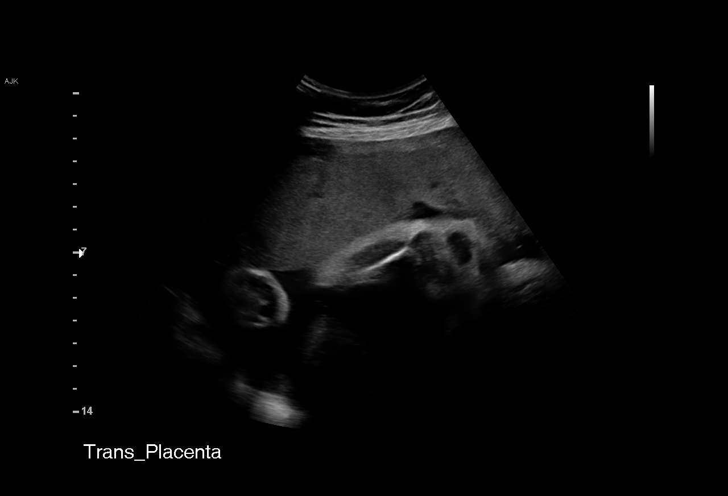
[im 19/34]
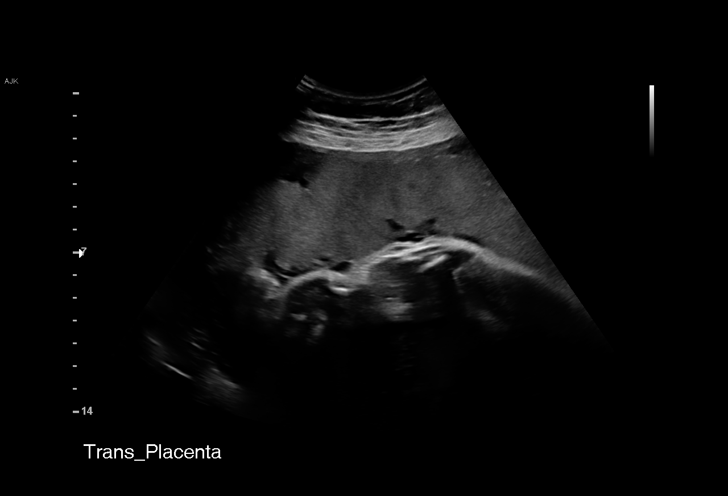
[im 21/34]
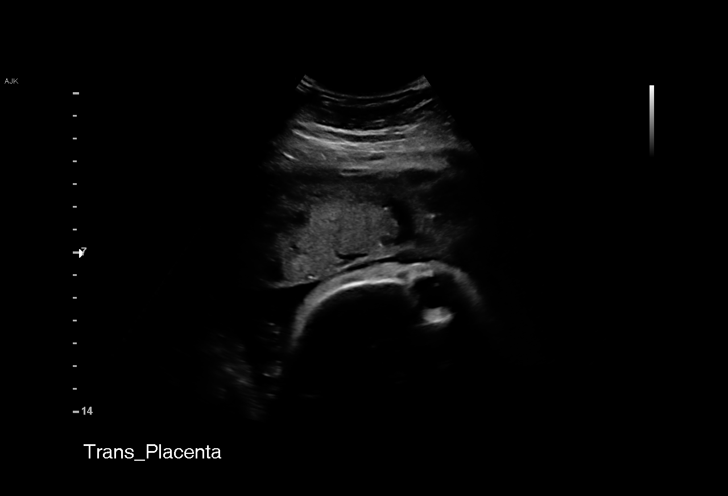
[im 24/34]
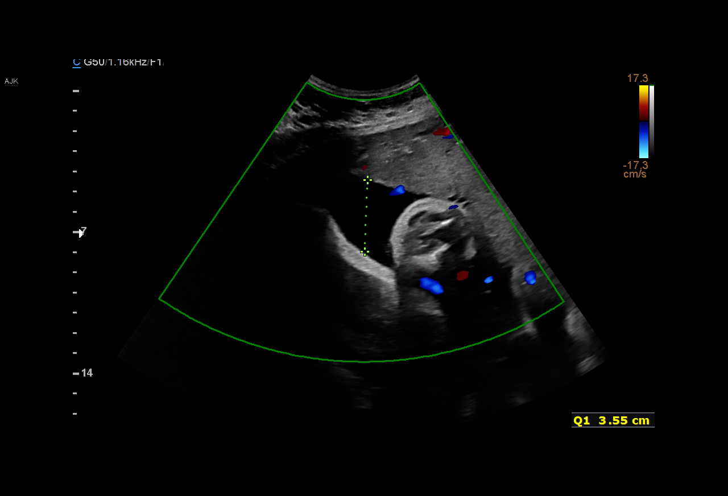
[im 26/34]
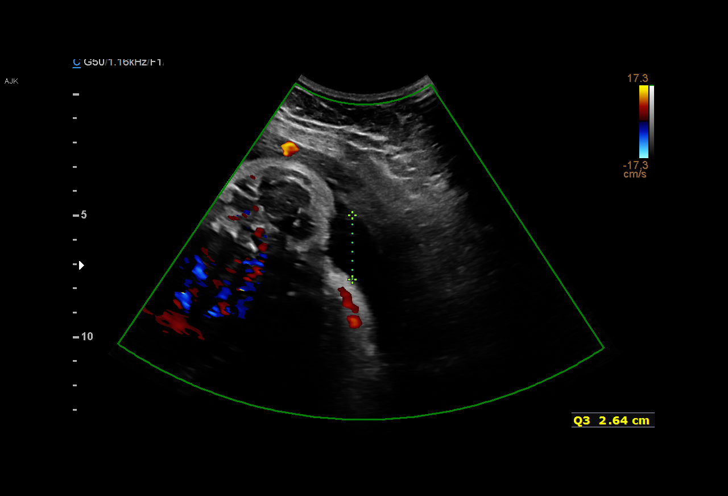
[im 29/34]
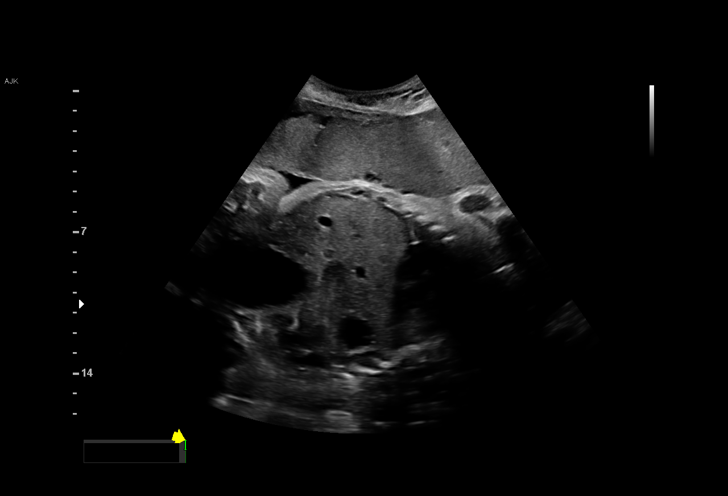
[im 31/34]
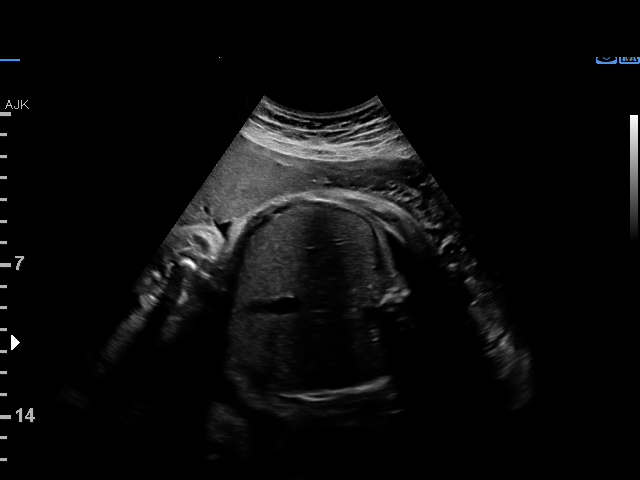
[im 34/34]
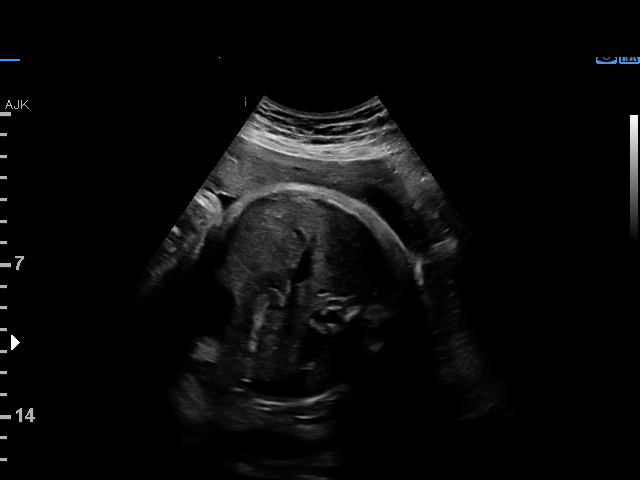

[15 of 28 positions shown; findings below may reference images not displayed]

KAKI

 ----------------------------------------------------------------------

 ----------------------------------------------------------------------
Indications

  37 weeks gestation of pregnancy
  Decreased fetal movement
  Non-reactive NST
 ----------------------------------------------------------------------
Vital Signs

                                                Height:        5'9"
Fetal Evaluation

 Num Of Fetuses:         1
 Fetal Heart Rate(bpm):  152
 Cardiac Activity:       Observed
 Presentation:           Cephalic
 Placenta:               Anterior
 P. Cord Insertion:      Visualized, central

 Amniotic Fluid
 AFI FV:      Within normal limits

 AFI Sum(cm)     %Tile       Largest Pocket(cm)
 12.29           41

 RUQ(cm)       RLQ(cm)       LUQ(cm)        LLQ(cm)


 Comment:    No placental abruption or previa identified. Stomach, bladder,
             and diaphragm seen.
Biophysical Evaluation
 Amniotic F.V:   Within normal limits       F. Tone:        Observed
 F. Movement:    Observed                   Score:          [DATE]
 F. Breathing:   Observed
OB History

 Gravidity:    1
Gestational Age

 LMP:           37w 0d        Date:  08/27/18                 EDD:   06/03/19
 Best:          37w 0d     Det. By:  LMP  (08/27/18)          EDD:   06/03/19
Impression

 Patient was evaluated the [facility]Y for complaints of decreased
 fetal movements.  NST is reactive.
 Amniotic fluid is normal and good fetal activity is seen.
 Antenatal testing is reassuring. BPP [DATE].  Cephalic
 presentation.
                 Boggan, Danuta

## 2022-03-03 ENCOUNTER — Ambulatory Visit (INDEPENDENT_AMBULATORY_CARE_PROVIDER_SITE_OTHER): Payer: 59 | Admitting: Family Medicine

## 2022-03-03 ENCOUNTER — Encounter: Payer: Self-pay | Admitting: Family Medicine

## 2022-03-03 VITALS — BP 108/68 | HR 73 | Temp 97.7°F | Resp 16 | Ht 69.0 in | Wt 181.8 lb

## 2022-03-03 DIAGNOSIS — Z23 Encounter for immunization: Secondary | ICD-10-CM

## 2022-03-03 DIAGNOSIS — Z7689 Persons encountering health services in other specified circumstances: Secondary | ICD-10-CM

## 2022-03-03 DIAGNOSIS — Z8639 Personal history of other endocrine, nutritional and metabolic disease: Secondary | ICD-10-CM

## 2022-03-03 NOTE — Progress Notes (Signed)
New Patient Office Visit  Subjective    Patient ID: Karen Patel, female    DOB: Apr 19, 1990  Age: 32 y.o. MRN: [id number]  CC:  Chief Complaint  Patient presents with   Establish Care    HPI Karen Patel presents to establish care. They spend several months a year in [place] - husband is able to work remote. Patient stays home with their three-year-old son. She speaks some [place], but husband is here to help translate for her.   Reports she had routine blood work done in [place] about a month ago and she did have LDL elevated at 114, but reports other labs were stable. She eats a regular diet and is active with her toddler. Patient denies any chest pain, palpitations, dyspnea, wheezing, edema, recurrent headaches, vision changes.   Reports she was following with [facility] for last pregnancy. After delivery, she had a cholecystectomy in 2021 and has been feeling good since then. They are considering trying for another baby soon.     Outpatient Encounter Medications as of 03/03/2022  Medication Sig   Omega-3 Fatty Acids (FISH OIL CONCENTRATE PO) Take by mouth.   [DISCONTINUED] aluminum hydroxide-magnesium carbonate (GAVISCON) 95-358 MG/15ML SUSP Take 30 mLs by mouth 3 (three) times daily as needed for indigestion or heartburn.   [DISCONTINUED] Cholecalciferol (VITAMIN D3 PO) Take 6 drops by mouth every other day.   [DISCONTINUED] ferrous sulfate 325 (65 FE) MG tablet Take 325 mg by mouth daily with breakfast.   [DISCONTINUED] hydrocortisone (ANUSOL-HC) 25 MG suppository Place 1 suppository (25 mg total) rectally 2 (two) times daily.   [DISCONTINUED] Omega-3 Fatty Acids (FISH OIL CONCENTRATE) 300 MG CAPS Take 1 capsule by mouth once a week.    [DISCONTINUED] polyethylene glycol (MIRALAX MIX-IN PAX) 17 g packet Take 17 g by mouth 2 (two) times daily. (Patient not taking: Reported on 06/26/2019)   [DISCONTINUED] Prenatal Vit-Fe Fumarate-FA (PRENATAL VITAMIN) 27-0.8 MG TABS Take 1 capsule by  mouth daily.   [DISCONTINUED] senna (SENOKOT) 8.6 MG TABS tablet Take 1 tablet (8.6 mg total) by mouth 2 (two) times daily. (Patient not taking: Reported on 06/26/2019)   No facility-administered encounter medications on file as of 03/03/2022.    Past Medical History:  Diagnosis Date   Medical history non-contributory     Past Surgical History:  Procedure Laterality Date   CHOLECYSTECTOMY, LAPAROSCOPIC  2021    Family History  Problem Relation Age of Onset   Diabetes Mother    Hypertension Mother     Social History   Socioeconomic History   Marital status: Married    Spouse name: Not on file   Number of children: Not on file   Years of education: Not on file   Highest education level: Not on file  Occupational History   Not on file  Tobacco Use   Smoking status: Never   Smokeless tobacco: Never  Substance and Sexual Activity   Alcohol use: Never   Drug use: Never   Sexual activity: Yes    Birth control/protection: None  Other Topics Concern   Not on file  Social History Narrative   Not on file   Social Determinants of Health   Financial Resource Strain: Not on file  Food Insecurity: Not on file  Transportation Needs: Not on file  Physical Activity: Not on file  Stress: Not on file  Social Connections: Not on file  Intimate Partner Violence: Not on file    ROS All review of systems negative except  what is listed in the HPI      Objective    BP 108/68   Pulse 73   Temp 97.7 F (36.5 C)   Resp 16   Ht 5\' 9"  (1.753 m)   Wt 181 lb 12.8 oz (82.5 kg)   SpO2 98%   BMI 26.85 kg/m   Physical Exam Vitals reviewed.  Constitutional:      Appearance: Normal appearance.  Cardiovascular:     Rate and Rhythm: Normal rate and regular rhythm.     Pulses: Normal pulses.     Heart sounds: Normal heart sounds.  Pulmonary:     Effort: Pulmonary effort is normal.     Breath sounds: Normal breath sounds.  Skin:    General: Skin is warm and dry.   Neurological:     Mental Status: She is alert and oriented to person, place, and time.  Psychiatric:        Mood and Affect: Mood normal.        Behavior: Behavior normal.        Thought Content: Thought content normal.        Judgment: Judgment normal.           Assessment & Plan:   Problem List Items Addressed This Visit   None Visit Diagnoses     Encounter to establish care    -  Primary - due for pap (will complete at upcoming physical unless she prefers to go to GYN)    History of high cholesterol     Deferred additional labs today  Mildly elevated per report and no other high risk factors.  Lifestyle factors for lowering cholesterol include: Diet therapy - heart-healthy diet rich in fruits, veggies, fiber-rich whole grains, lean meats, chicken, fish (at least twice a week), fat-free or 1% dairy products; foods low in saturated/trans fats, cholesterol, sodium, and sugar. Mediterranean diet has shown to be very heart healthy. Regular exercise - recommend at least 30 minutes a day, 5 times per week Weight management         Return for physical with pap at your convenience .   Terrilyn Saver, NP

## 2022-03-03 NOTE — Addendum Note (Signed)
Addended by: Randolm Idol A on: 03/03/2022 12:54 PM   Modules accepted: Orders

## 2022-03-03 NOTE — Patient Instructions (Signed)
Thank you for choosing [facility] for your Primary Care needs. I am excited for the opportunity to partner with you to meet your health care goals. It was a pleasure meeting you today!  Information on diet, exercise, and health maintenance recommendations are listed below. This is information to help you be sure you are on track for optimal health and monitoring.   Please look over this and let us know if you have any questions or if you have completed any of the health maintenance outside of [facility] so that we can be sure your records are up to date.  ___________________________________________________________  MyChart:  For all urgent or time sensitive needs we ask that you please call the office to avoid delays. Our number is [phone]. MyChart is not constantly monitored and due to the large volume of messages a day, replies may take up to 72 business hours.  MyChart Policy: MyChart allows for you to see your visit notes, after visit summary, provider recommendations, lab and tests results, make an appointment, request refills, and contact your provider or the office for non-urgent questions or concerns. Providers are seeing patients during normal business hours and do not have built in time to review MyChart messages.  We ask that you allow a minimum of 3 business days for responses to MyChart messages. For this reason, please do not send urgent requests through MyChart. Please call the office at [phone]. New and ongoing conditions may require a visit. We have virtual and in-person visits available for your convenience.  Complex MyChart concerns may require a visit. Your provider may request you schedule a virtual or in-person visit to ensure we are providing the best care possible. MyChart messages sent after 11:00 AM on Friday will not be received by the provider until Monday morning.    Lab and Test Results: You will receive your lab and test  results on MyChart as soon as they are completed and results have been sent by the lab or testing facility. Due to this service, you will receive your results BEFORE your provider.  I review lab and test results each morning prior to seeing patients. Some results require collaboration with other providers to ensure you are receiving the most appropriate care. For this reason, we ask that you please allow a minimum of 3-5 business days from the time that ALL results have been received for your provider to receive and review lab and test results and contact you about these.  Most lab and test result comments from the provider will be sent through MyChart. Your provider may recommend changes to the plan of care, follow-up visits, repeat testing, ask questions, or request an office visit to discuss these results. You may reply directly to this message or call the office to provide information for the provider or set up an appointment. In some instances, you will be called with test results and recommendations. Please let us know if this is preferred and we will make note of this in your chart to provide this for you.    If you have not heard a response to your lab or test results in 5 business days from all results returning to MyChart, please call the office to let us know. We ask that you please avoid calling prior to this time unless there is an emergent concern. Due to high call volumes, this can delay the resulting process.  After Hours: For all non-emergency after hours needs, please   call the office at [phone] and select the option to reach the on-call  service. On-call services are shared between multiple [place] [facility] offices and therefore it will not be possible to speak directly with your provider. On-call providers may provide medical advice and recommendations, but are unable to provide refills for maintenance medications.  For all emergency or urgent medical needs after normal business hours, we  recommend that you seek care at the closest Urgent Care or Emergency Department to ensure appropriate treatment in a timely manner.  [facility] has a 24 hour emergency room located on the ground floor for your convenience.   Urgent Concerns During the Business Day Providers are seeing patients from 8AM to 5PM with a busy schedule and are most often not able to respond to non-urgent calls until the end of the day or the next business day. If you should have URGENT concerns during the day, please call and speak to the nurse or schedule a same day appointment so that we can address your concern without delay.   Thank you, again, for choosing me as your health care partner. I appreciate your trust and look forward to learning more about you!   Kortney Schoenfelder B. Jeancarlo Leffler, DNP, FNP-C  ___________________________________________________________  Health Maintenance Recommendations Screening Testing Mammogram Every 1-2 years based on history and risk factors Starting at age 50 Pap Smear Ages 21-39 every 3 years Ages 30-65 every 5 years with HPV testing More frequent testing may be required based on results and history Colon Cancer Screening Every 1-10 years based on test performed, risk factors, and history Starting at age 45 Bone Density Screening Every 2-10 years based on history Starting at age 65 for women Recommendations for men differ based on medication usage, history, and risk factors AAA Screening One time ultrasound Men 65-75 years old who have ever smoked Lung Cancer Screening Low Dose Lung CT every 12 months Age 50-80 years with a 20 pack-year smoking history who still smoke or who have quit within the last 15 years  Screening Labs Routine  Labs: Complete Blood Count (CBC), Complete Metabolic Panel (CMP), Cholesterol (Lipid Panel) Every 6-12 months based on history and medications May be recommended more frequently based on current conditions or previous results Hemoglobin  A1c Lab Every 3-12 months based on history and previous results Starting at age 45 or earlier with diagnosis of diabetes, high cholesterol, BMI >26, and/or risk factors Frequent monitoring for patients with diabetes to ensure blood sugar control Thyroid Panel  Every 6 months based on history, symptoms, and risk factors May be repeated more often if on medication HIV One time testing for all patients 13 and older May be repeated more frequently for patients with increased risk factors or exposure Hepatitis C One time testing for all patients 18 and older May be repeated more frequently for patients with increased risk factors or exposure Gonorrhea, Chlamydia Every 12 months for all sexually active persons 13-24 years Additional monitoring may be recommended for those who are considered high risk or who have symptoms PSA Men 40-54 years old with risk factors Additional screening may be recommended from age 55-69 based on risk factors, symptoms, and history  Vaccine Recommendations Tetanus Booster All adults every 10 years Flu Vaccine All patients 6 months and older every year COVID Vaccine All patients 12 years and older Initial dosing with booster May recommend additional booster based on age and health history HPV Vaccine 2 doses all patients age 9-26 Dosing may be considered   for patients over 26 Shingles Vaccine (Shingrix) 2 doses all adults 50 years and older Pneumonia (Pneumovax 23) All adults 65 years and older May recommend earlier dosing based on health history Pneumonia (Prevnar 13) All adults 65 years and older Dosed 1 year after Pneumovax 23 Pneumonia (Prevnar 20) All adults 65 years and older (adults 19-64 with certain conditions or risk factors) 1 dose  For those who have not received Prevnar 13 vaccine previously   Additional Screening, Testing, and Vaccinations may be recommended on an individualized basis based on family history, health history, risk  factors, and/or exposure.  __________________________________________________________  Diet Recommendations for All Patients  I recommend that all patients maintain a diet low in saturated fats, carbohydrates, and cholesterol. While this can be challenging at first, it is not impossible and small changes can make big differences.  Things to try: Decreasing the amount of soda, sweet tea, and/or juice to one or less per day and replace with water While water is always the first choice, if you do not like water you may consider adding a water additive without sugar to improve the taste other sugar free drinks Replace potatoes with a brightly colored vegetable  Use healthy oils, such as canola oil or olive oil, instead of butter or hard margarine Limit your bread intake to two pieces or less a day Replace regular pasta with low carb pasta options Bake, broil, or grill foods instead of frying Monitor portion sizes  Eat smaller, more frequent meals throughout the day instead of large meals  An important thing to remember is, if you love foods that are not great for your health, you don't have to give them up completely. Instead, allow these foods to be a reward when you have done well. Allowing yourself to still have special treats every once in a while is a nice way to tell yourself thank you for working hard to keep yourself healthy.   Also remember that every day is a new day. If you have a bad day and "fall off the wagon", you can still climb right back up and keep moving along on your journey!  We have resources available to help you!  Some websites that may be helpful include: www.MyPlate.gov  Www.VeryWellFit.com _____________________________________________________________  Activity Recommendations for All Patients  I recommend that all adults get at least 20 minutes of moderate physical activity that elevates your heart rate at least 5 days out of the week.  Some examples  include: Walking or jogging at a pace that allows you to carry on a conversation Cycling (stationary bike or outdoors) Water aerobics Yoga Weight lifting Dancing If physical limitations prevent you from putting stress on your joints, exercise in a pool or seated in a chair are excellent options.  Do determine your MAXIMUM heart rate for activity: 220 - YOUR AGE = MAX Heart Rate   Remember! Do not push yourself too hard.  Start slowly and build up your pace, speed, weight, time in exercise, etc.  Allow your body to rest between exercise and get good sleep. You will need more water than normal when you are exerting yourself. Do not wait until you are thirsty to drink. Drink with a purpose of getting in at least 8, 8 ounce glasses of water a day plus more depending on how much you exercise and sweat.    If you begin to develop dizziness, chest pain, abdominal pain, jaw pain, shortness of breath, headache, vision changes, lightheadedness, or other concerning symptoms,   stop the activity and allow your body to rest. If your symptoms are severe, seek emergency evaluation immediately. If your symptoms are concerning, but not severe, please let us know so that we can recommend further evaluation.     

## 2022-03-14 ENCOUNTER — Encounter: Payer: Self-pay | Admitting: Family Medicine

## 2022-04-07 ENCOUNTER — Ambulatory Visit: Payer: 59 | Admitting: Family Medicine

## 2022-05-07 NOTE — Progress Notes (Unsigned)
   Complete physical exam  Patient: Karen Patel   DOB: 11/01/1990   32 y.o. Female  MRN: [id number]  Subjective:    No chief complaint on file.   Karen Patel is a 32 y.o. female who presents today for a complete physical exam. She reports consuming a general diet. {types:19826} She generally feels {DESC; WELL/FAIRLY WELL/POORLY:18703}. She reports sleeping {DESC; WELL/FAIRLY WELL/POORLY:18703}. She {does/does not:200015} have additional problems to discuss today.   Last appointment with me was 03/03/22 to establish care, no new concerns at that time. Reports mildly high lipids in the past.   Most recent fall risk assessment:     No data to display           Most recent depression screenings:    03/03/2022    2:18 PM  PHQ 2/9 Scores  PHQ - 2 Score 1  PHQ- 9 Score 3    {VISON DENTAL STD PSA (Optional):27386}  {History (Optional):23778}  Patient Care Team: Terrilyn Saver, NP as PCP - General (Family Medicine)   Outpatient Medications Prior to Visit  Medication Sig   Omega-3 Fatty Acids (FISH OIL CONCENTRATE PO) Take by mouth.   No facility-administered medications prior to visit.    ROS        Objective:     There were no vitals taken for this visit. {Vitals History (Optional):23777}  Physical Exam   No results found for any visits on 05/08/22. {Show previous labs (optional):23779}    Assessment & Plan:    Routine Health Maintenance and Physical Exam  Immunization History  Administered Date(s) Administered   Influenza,inj,Quad PF,6+ Mos 01/22/2019, 03/03/2022   Tdap 03/11/2019    Health Maintenance  Topic Date Due   COVID-19 Vaccine (1) Never done   Hepatitis C Screening  Never done   PAP-Cervical Cytology Screening  10/23/2021   PAP SMEAR-Modifier  10/23/2021   DTaP/Tdap/Td (2 - Td or Tdap) 03/10/2029   INFLUENZA VACCINE  Completed   HIV Screening  Completed   HPV VACCINES  Aged Out    Discussed health benefits of physical activity, and  encouraged her to engage in regular exercise appropriate for her age and condition.  Problem List Items Addressed This Visit   None  No follow-ups on file.     Terrilyn Saver, NP

## 2022-05-08 ENCOUNTER — Ambulatory Visit (INDEPENDENT_AMBULATORY_CARE_PROVIDER_SITE_OTHER): Payer: 59 | Admitting: Family Medicine

## 2022-05-08 ENCOUNTER — Encounter: Payer: Self-pay | Admitting: Family Medicine

## 2022-05-08 VITALS — BP 98/64 | HR 58 | Resp 18 | Ht 69.0 in | Wt 181.2 lb

## 2022-05-08 DIAGNOSIS — Z1159 Encounter for screening for other viral diseases: Secondary | ICD-10-CM

## 2022-05-08 DIAGNOSIS — Z0000 Encounter for general adult medical examination without abnormal findings: Secondary | ICD-10-CM

## 2022-05-08 LAB — LIPID PANEL
Cholesterol: 180 mg/dL (ref 0–200)
HDL: 50.8 mg/dL (ref >39.00)
LDL Cholesterol: 116 mg/dL — ABNORMAL HIGH (ref 0–99)
NonHDL: 128.84
Total CHOL/HDL Ratio: 4
Triglycerides: 63 mg/dL (ref 0.0–149.0)
VLDL: 12.6 mg/dL (ref 0.0–40.0)

## 2022-05-08 LAB — COMPREHENSIVE METABOLIC PANEL
ALT: 13 U/L (ref 0–35)
AST: 12 U/L (ref 0–37)
Albumin: 4.4 g/dL (ref 3.5–5.2)
Alkaline Phosphatase: 40 U/L (ref 39–117)
BUN: 15 mg/dL (ref 6–23)
CO2: 25 mEq/L (ref 19–32)
Calcium: 9.3 mg/dL (ref 8.4–10.5)
Chloride: 105 mEq/L (ref 96–112)
Creatinine, Ser: 0.65 mg/dL (ref 0.40–1.20)
GFR: 118.71 mL/min (ref >60.00)
Glucose, Bld: 98 mg/dL (ref 70–99)
Potassium: 4.2 mEq/L (ref 3.5–5.1)
Sodium: 137 mEq/L (ref 135–145)
Total Bilirubin: 0.7 mg/dL (ref 0.2–1.2)
Total Protein: 6.9 g/dL (ref 6.0–8.3)

## 2022-05-08 LAB — CBC WITH DIFFERENTIAL/PLATELET
Basophils Absolute: 0 10*3/uL (ref 0.0–0.1)
Basophils Relative: 0.6 % (ref 0.0–3.0)
Eosinophils Absolute: 0.1 10*3/uL (ref 0.0–0.7)
Eosinophils Relative: 1.9 % (ref 0.0–5.0)
HCT: 38.4 % (ref 36.0–46.0)
Hemoglobin: 12.9 g/dL (ref 12.0–15.0)
Lymphocytes Relative: 31.4 % (ref 12.0–46.0)
Lymphs Abs: 1.8 10*3/uL (ref 0.7–4.0)
MCHC: 33.6 g/dL (ref 30.0–36.0)
MCV: 89.2 fl (ref 78.0–100.0)
Monocytes Absolute: 0.5 10*3/uL (ref 0.1–1.0)
Monocytes Relative: 8.4 % (ref 3.0–12.0)
Neutro Abs: 3.3 10*3/uL (ref 1.4–7.7)
Neutrophils Relative %: 57.7 % (ref 43.0–77.0)
Platelets: 228 10*3/uL (ref 150.0–400.0)
RBC: 4.3 Mil/uL (ref 3.87–5.11)
RDW: 13 % (ref 11.5–15.5)
WBC: 5.8 10*3/uL (ref 4.0–10.5)

## 2022-05-08 LAB — HEPATITIS C ANTIBODY: Hepatitis C Ab: NONREACTIVE

## 2022-05-08 NOTE — Patient Instructions (Signed)
Both ears have wax. Recommend [place] from the pharmacy (over-the-counter) to soften the wax. If bothersome, come back after trying the drops for several days and we can clean out your ears.

## 2022-05-09 LAB — TSH: TSH: 2.85 u[IU]/mL (ref 0.35–5.50)

## 2022-07-20 ENCOUNTER — Other Ambulatory Visit (HOSPITAL_BASED_OUTPATIENT_CLINIC_OR_DEPARTMENT_OTHER): Payer: Self-pay

## 2022-07-20 MED ORDER — AMOXICILLIN 500 MG PO CAPS
500.0000 mg | ORAL_CAPSULE | Freq: Three times a day (TID) | ORAL | 0 refills | Status: DC
  Filled 2022-07-20: qty 50, 17d supply, fill #0

## 2022-09-14 ENCOUNTER — Encounter: Payer: Self-pay | Admitting: Family Medicine

## 2022-09-14 NOTE — Telephone Encounter (Signed)
Appt scheduled 09/21/22

## 2022-09-21 ENCOUNTER — Ambulatory Visit (INDEPENDENT_AMBULATORY_CARE_PROVIDER_SITE_OTHER): Payer: 59 | Admitting: Family Medicine

## 2022-09-21 ENCOUNTER — Encounter: Payer: Self-pay | Admitting: Family Medicine

## 2022-09-21 VITALS — BP 99/67 | HR 62 | Ht 69.0 in | Wt 182.0 lb

## 2022-09-21 DIAGNOSIS — N644 Mastodynia: Secondary | ICD-10-CM | POA: Diagnosis not present

## 2022-09-21 NOTE — Progress Notes (Signed)
   Acute Office Visit  Subjective:     Patient ID: Karen Patel, female    DOB: 05-14-1990, 32 y.o.   MRN: [id number]  Chief Complaint  Patient presents with   Breast Pain    HPI Patient is in today for breast concern.   Discussed the use of AI scribe software for clinical note transcription with the patient, who gave verbal consent to proceed.  History of Present Illness   The patient presents with concerns about breast health, specifically tenderness during menstrual periods and the development of skin tags during breastfeeding. The patient reports that one breast was not used for breastfeeding for approximately a year, leading to a perceived hardness in that breast. However, the patient denies any changes in nipple appearance, discharge, or bleeding. The patient also denies any palpable lumps or bumps in the breast tissue and there is no family history of breast cancer. Reports breasts are symmetrical now.  The patient has a high caffeine intake, which may be contributing to the breast tenderness.         ROS All review of systems negative except what is listed in the HPI      Objective:    BP 99/67   Pulse 62   Ht 5\' 9"  (1.753 m)   Wt 182 lb (82.6 kg)   SpO2 99%   BMI 26.88 kg/m    Physical Exam Vitals reviewed.  Constitutional:      Appearance: Normal appearance.  Chest:     Chest wall: No mass, lacerations, deformity, swelling, tenderness, crepitus or edema.  Breasts:    Breasts are symmetrical.     Right: Normal. No swelling, bleeding, inverted nipple, mass, nipple discharge, skin change or tenderness.     Left: Normal. No swelling, bleeding, inverted nipple, mass, nipple discharge, skin change or tenderness.  Lymphadenopathy:     Upper Body:     Right upper body: No supraclavicular or axillary adenopathy.     Left upper body: No supraclavicular or axillary adenopathy.  Neurological:     Mental Status: She is alert and oriented to person, place, and  time.  Psychiatric:        Mood and Affect: Mood normal.        Behavior: Behavior normal.        Thought Content: Thought content normal.        Judgment: Judgment normal.     No results found for any visits on 09/21/22.      Assessment & Plan:   Problem List Items Addressed This Visit   None Visit Diagnoses     Breast tenderness    -  Primary     Tenderness during menstrual periods, likely hormonal. High caffeine intake may exacerbate symptoms. No current symptoms or family history of breast cancer. Normal exam. -Reduce caffeine intake. -Continue self-breast exams monthly and follow-up for any new findings -Start routine screening mammograms at age 4, unless new symptoms develop.        No orders of the defined types were placed in this encounter.   Return if symptoms worsen or fail to improve.  Clayborne Dana, NP

## 2022-09-30 ENCOUNTER — Encounter (HOSPITAL_BASED_OUTPATIENT_CLINIC_OR_DEPARTMENT_OTHER): Payer: 59 | Admitting: Radiology

## 2022-09-30 DIAGNOSIS — Z1231 Encounter for screening mammogram for malignant neoplasm of breast: Secondary | ICD-10-CM

## 2022-10-03 ENCOUNTER — Other Ambulatory Visit (HOSPITAL_BASED_OUTPATIENT_CLINIC_OR_DEPARTMENT_OTHER): Payer: Self-pay

## 2022-12-18 ENCOUNTER — Ambulatory Visit (INDEPENDENT_AMBULATORY_CARE_PROVIDER_SITE_OTHER): Payer: 59 | Admitting: Family Medicine

## 2022-12-18 ENCOUNTER — Other Ambulatory Visit (HOSPITAL_BASED_OUTPATIENT_CLINIC_OR_DEPARTMENT_OTHER): Payer: Self-pay

## 2022-12-18 ENCOUNTER — Encounter: Payer: Self-pay | Admitting: Family Medicine

## 2022-12-18 VITALS — BP 107/78 | HR 70 | Temp 97.7°F | Ht 69.0 in | Wt 182.0 lb

## 2022-12-18 DIAGNOSIS — J029 Acute pharyngitis, unspecified: Secondary | ICD-10-CM | POA: Diagnosis not present

## 2022-12-18 LAB — POC COVID19 BINAXNOW: SARS Coronavirus 2 Ag: NEGATIVE

## 2022-12-18 LAB — POCT RAPID STREP A (OFFICE): Rapid Strep A Screen: NEGATIVE

## 2022-12-18 MED ORDER — LIDOCAINE VISCOUS HCL 2 % MT SOLN
15.0000 mL | OROMUCOSAL | 0 refills | Status: DC | PRN
  Filled 2022-12-18: qty 100, 1d supply, fill #0

## 2022-12-18 NOTE — Patient Instructions (Signed)
Strep and COVID negative Likely viral pharyngitis Adding as needed Lidocaine solution for sore throat Okay to take tylenol, ibuprofen, warm liquids, honey, lemon, etc If not making progress by the end of the week, or if worsening before then, please schedule a follow-up so we can reassess.

## 2022-12-18 NOTE — Progress Notes (Signed)
Acute Office Visit  Subjective:     Patient ID: Karen Patel, female    DOB: 1990/11/14, 32 y.o.   MRN: [id number]  Chief Complaint  Patient presents with   Sore Throat    HPI Patient is in today for sore throat.   Discussed the use of AI scribe software for clinical note transcription with the patient, who gave verbal consent to proceed.  History of Present Illness   /The patient presents with a chief complaint of sore throat, which began two days prior to the consultation. The onset of the sore throat was sudden, starting the night before the consultation. The patient describes the pain as significant up to 9/10. She attempted to manage the pain with Tylenol, which was ineffective. The patient also reported feeling warm, suggestive of a possible fever, but did not measure her temperature.  The patient denies any associated symptoms such as cough, ear pain, or sinus pressure. She also denies any recent exposure to sick contacts. However, it is noteworthy that her son had an ear infection the previous week, which required antibiotic treatment.  The patient also reports difficulty swallowing due to the pain. She denies any ear pain or pressure.             ROS All review of systems negative except what is listed in the HPI      Objective:    BP 107/78   Pulse 70   Temp 97.7 F (36.5 C) (Oral)   Ht 5\' 9"  (1.753 m)   Wt 182 lb (82.6 kg)   SpO2 100%   BMI 26.88 kg/m    Physical Exam Vitals reviewed.  Constitutional:      Appearance: Normal appearance.  HENT:     Head: Normocephalic and atraumatic.     Right Ear: There is impacted cerumen.     Left Ear: Tympanic membrane normal.     Nose: No congestion or rhinorrhea.     Mouth/Throat:     Pharynx: Posterior oropharyngeal erythema present. No pharyngeal swelling, oropharyngeal exudate or uvula swelling.     Tonsils: No tonsillar exudate or tonsillar abscesses. 1+ on the right. 1+ on the left.     Comments:  Cobblestoning/PND Eyes:     Conjunctiva/sclera: Conjunctivae normal.  Cardiovascular:     Rate and Rhythm: Normal rate and regular rhythm.     Heart sounds: Normal heart sounds.  Pulmonary:     Effort: Pulmonary effort is normal.     Breath sounds: Normal breath sounds.  Musculoskeletal:     Cervical back: Normal range of motion and neck supple. Tenderness present.  Lymphadenopathy:     Cervical: No cervical adenopathy.  Skin:    General: Skin is warm and dry.  Neurological:     Mental Status: She is alert and oriented to person, place, and time.  Psychiatric:        Mood and Affect: Mood normal.        Behavior: Behavior normal.        Thought Content: Thought content normal.        Judgment: Judgment normal.            Results for orders placed or performed in visit on 12/18/22  POC COVID-19 BinaxNow  Result Value Ref Range   SARS Coronavirus 2 Ag Negative Negative  POCT rapid strep A  Result Value Ref Range   Rapid Strep A Screen Negative Negative        Assessment & Plan:  Problem List Items Addressed This Visit   None Visit Diagnoses     Pharyngitis, unspecified etiology    -  Primary   Relevant Medications   lidocaine (XYLOCAINE) 2 % solution   Other Relevant Orders   POC COVID-19 BinaxNow (Completed)   POCT rapid strep A (Completed)     Strep and COVID negative Likely viral pharyngitis Adding as needed Lidocaine solution for sore throat Okay to take tylenol, ibuprofen, warm liquids, honey, lemon, etc If not making progress by the end of the week, or if worsening before then, please schedule a follow-up so we can reassess.   Meds ordered this encounter  Medications   lidocaine (XYLOCAINE) 2 % solution    Sig: Use as directed 15 mLs in the mouth or throat every 4 (four) hours as needed for mouth pain.    Dispense:  100 mL    Refill:  0    Order Specific Question:   Supervising Provider    Answer:   Danise Edge A [[id number]]    Return if  symptoms worsen or fail to improve.  Clayborne Dana, NP

## 2022-12-18 NOTE — Progress Notes (Signed)
   Acute Office Visit  Subjective:     Patient ID: Karen Patel, female    DOB: October 16, 1990, 32 y.o.   MRN: [id number]  Chief Complaint  Patient presents with   Sore Throat    Sore Throat     Patient is in today for sore throat. Patient reports she started to feel unwell 2 days ago, with throat pain worsening last night. Patient states her son recently had an ear infection. Patient did not physically measure temperature but reports feeling feverish. Patient denies cough, congestion, shortness of breath. Patient reports sore throat pain is 9-10 out of 10; states she took tylenol with no relief.    ROS  All body systems negative except for what is discussed.     Objective:    BP 107/78   Pulse 70   Temp 97.7 F (36.5 C) (Oral)   Ht 5\' 9"  (1.753 m)   Wt 182 lb (82.6 kg)   SpO2 100%   BMI 26.88 kg/m    Physical Exam Constitutional:      Appearance: She is well-developed.  HENT:     Head: Normocephalic and atraumatic.     Comments: Cobblestoning to back of throat    Right Ear: Tympanic membrane normal.     Left Ear: Tympanic membrane normal.     Mouth/Throat:     Mouth: Mucous membranes are moist.     Pharynx: Uvula midline. Posterior oropharyngeal erythema present. No pharyngeal swelling or oropharyngeal exudate.     Tonsils: No tonsillar exudate or tonsillar abscesses. 2+ on the right. 2+ on the left.  Cardiovascular:     Rate and Rhythm: Normal rate and regular rhythm.     Heart sounds: Normal heart sounds.  Neurological:     General: No focal deficit present.     Mental Status: She is alert and oriented to person, place, and time.     Results for orders placed or performed in visit on 12/18/22  POC COVID-19 BinaxNow  Result Value Ref Range   SARS Coronavirus 2 Ag Negative Negative  POCT rapid strep A  Result Value Ref Range   Rapid Strep A Screen Negative Negative        Assessment & Plan:   Viral pharyngitis  Supportive measures:   As needed  Lidocaine solution for sore throat Okay to take tylenol, ibuprofen, warm liquids, honey, lemon, etc If not making progress by the end of the week, or if worsening before then, please schedule a follow-up so we can reassess.   Strep & COVID test negative   Meds ordered this encounter  Medications   lidocaine (XYLOCAINE) 2 % solution    Sig: Use as directed 15 mLs in the mouth or throat every 4 (four) hours as needed for mouth pain.    Dispense:  100 mL    Refill:  0    Order Specific Question:   Supervising Provider    Answer:   Danise Edge A [[id number]]    Return if symptoms worsen or fail to improve.  Luisa Hart, RN

## 2023-01-08 ENCOUNTER — Other Ambulatory Visit (HOSPITAL_BASED_OUTPATIENT_CLINIC_OR_DEPARTMENT_OTHER): Payer: Self-pay

## 2023-01-08 MED ORDER — IBUPROFEN 800 MG PO TABS
800.0000 mg | ORAL_TABLET | Freq: Three times a day (TID) | ORAL | 0 refills | Status: DC | PRN
  Filled 2023-01-08: qty 16, 6d supply, fill #0

## 2023-01-08 MED ORDER — AMOXICILLIN 500 MG PO CAPS
500.0000 mg | ORAL_CAPSULE | Freq: Three times a day (TID) | ORAL | 0 refills | Status: DC
  Filled 2023-01-08: qty 21, 7d supply, fill #0

## 2023-04-03 ENCOUNTER — Encounter: Payer: Self-pay | Admitting: Family Medicine

## 2023-04-03 ENCOUNTER — Ambulatory Visit (HOSPITAL_BASED_OUTPATIENT_CLINIC_OR_DEPARTMENT_OTHER)
Admission: RE | Admit: 2023-04-03 | Discharge: 2023-04-03 | Disposition: A | Discharge status: Home / Self Care | Payer: 59 | Source: Ambulatory Visit | Attending: Family Medicine | Admitting: Family Medicine

## 2023-04-03 ENCOUNTER — Ambulatory Visit (INDEPENDENT_AMBULATORY_CARE_PROVIDER_SITE_OTHER): Payer: 59 | Admitting: Family Medicine

## 2023-04-03 VITALS — BP 109/73 | HR 82 | Ht 69.0 in | Wt 180.0 lb

## 2023-04-03 DIAGNOSIS — R1033 Periumbilical pain: Secondary | ICD-10-CM

## 2023-04-03 DIAGNOSIS — R1031 Right lower quadrant pain: Secondary | ICD-10-CM

## 2023-04-03 LAB — CBC WITH DIFFERENTIAL/PLATELET
Basophils Absolute: 0 10*3/uL (ref 0.0–0.1)
Basophils Relative: 0.4 % (ref 0.0–3.0)
Eosinophils Absolute: 0.1 10*3/uL (ref 0.0–0.7)
Eosinophils Relative: 1.3 % (ref 0.0–5.0)
HCT: 38.8 % (ref 36.0–46.0)
Hemoglobin: 13 g/dL (ref 12.0–15.0)
Lymphocytes Relative: 26.3 % (ref 12.0–46.0)
Lymphs Abs: 1.6 10*3/uL (ref 0.7–4.0)
MCHC: 33.5 g/dL (ref 30.0–36.0)
MCV: 89.4 fL (ref 78.0–100.0)
Monocytes Absolute: 0.5 10*3/uL (ref 0.1–1.0)
Monocytes Relative: 7.4 % (ref 3.0–12.0)
Neutro Abs: 3.9 10*3/uL (ref 1.4–7.7)
Neutrophils Relative %: 64.6 % (ref 43.0–77.0)
Platelets: 237 10*3/uL (ref 150.0–400.0)
RBC: 4.34 Mil/uL (ref 3.87–5.11)
RDW: 13.2 % (ref 11.5–15.5)
WBC: 6.1 10*3/uL (ref 4.0–10.5)

## 2023-04-03 LAB — COMPREHENSIVE METABOLIC PANEL
ALT: 20 U/L (ref 0–35)
AST: 15 U/L (ref 0–37)
Albumin: 4.4 g/dL (ref 3.5–5.2)
Alkaline Phosphatase: 50 U/L (ref 39–117)
BUN: 9 mg/dL (ref 6–23)
CO2: 30 meq/L (ref 19–32)
Calcium: 9.2 mg/dL (ref 8.4–10.5)
Chloride: 103 meq/L (ref 96–112)
Creatinine, Ser: 0.61 mg/dL (ref 0.40–1.20)
GFR: 119.78 mL/min (ref >60.00)
Glucose, Bld: 89 mg/dL (ref 70–99)
Potassium: 4 meq/L (ref 3.5–5.1)
Sodium: 138 meq/L (ref 135–145)
Total Bilirubin: 0.8 mg/dL (ref 0.2–1.2)
Total Protein: 7.4 g/dL (ref 6.0–8.3)

## 2023-04-03 LAB — POCT URINE PREGNANCY: Preg Test, Ur: NEGATIVE

## 2023-04-03 LAB — LIPASE: Lipase: 23 U/L (ref 11.0–59.0)

## 2023-04-03 LAB — AMYLASE: Amylase: 30 U/L (ref 27–131)

## 2023-04-03 MED ORDER — IOHEXOL 300 MG/ML  SOLN
100.0000 mL | Freq: Once | INTRAMUSCULAR | Status: AC | PRN
  Administered 2023-04-03: 100 mL via INTRAVENOUS

## 2023-04-03 NOTE — Progress Notes (Signed)
Acute Office Visit  Subjective:     Patient ID: Karen Patel, female    DOB: February 10, 1991, 33 y.o.   MRN: [id number]  Chief Complaint  Patient presents with   Abdominal Pain     Patient is in today for abdominal pain.  Discussed the use of AI scribe software for clinical note transcription with the patient, who gave verbal consent to proceed.  History of Present Illness   Karen Patel is a 33 year old female with a history of gallbladder removal 4 years ago.  She has been experiencing abdominal pain for the past four days, described as a dull, gnawing, and achy sensation located in the right and middle abdomen. The pain is rated as 3 to 4 out of 10 in severity, is constant, and does not change with eating or fasting. No associated nausea, vomiting, diarrhea, fever, or constipation. Her last bowel movement was normal, occurring yesterday afternoon. The pain does not radiate to the chest and is not linked with any rashes. It is very sensitive to pressure, especially around the belly button and the right lower side.  She has a history of gallbladder removal a couple of years ago. She is concerned that her current symptoms might be related to a similar issue. She notes a lack of appetite but denies any significant nausea or burning sensation similar to her usual reflux symptoms.  Her menstrual cycle has been irregular, with her last period occurring ten days ago after a cycle of 40 to 45 days.      They are leaving for [place] in 2 days.      All review of systems negative except what is listed in the HPI      Objective:    BP 109/73   Pulse 82   Ht 5\' 9"  (1.753 m)   Wt 180 lb (81.6 kg)   SpO2 100%   BMI 26.58 kg/m    Physical Exam Vitals reviewed.  Constitutional:      Appearance: Normal appearance.  Cardiovascular:     Rate and Rhythm: Normal rate and regular rhythm.  Pulmonary:     Effort: Pulmonary effort is normal.     Breath sounds: Normal breath sounds.   Abdominal:     General: Abdomen is flat. Bowel sounds are normal.     Palpations: Abdomen is soft.     Tenderness: There is abdominal tenderness in the right lower quadrant and epigastric area. There is rebound. Positive signs include McBurney's sign.  Skin:    General: Skin is warm and dry.  Neurological:     Mental Status: She is alert and oriented to person, place, and time.  Psychiatric:        Mood and Affect: Mood normal.        Behavior: Behavior normal.        Thought Content: Thought content normal.        Judgment: Judgment normal.         No results found for any visits on 04/03/23.      Assessment & Plan:   Problem List Items Addressed This Visit   None Visit Diagnoses       Right lower quadrant abdominal pain    -  Primary   Relevant Orders   CBC with Differential/Platelet   Comprehensive metabolic panel   Amylase   Lipase   CT ABDOMEN PELVIS W CONTRAST     Acute periumbilical pain       Relevant Orders  CBC with Differential/Platelet   Comprehensive metabolic panel   Amylase   Lipase   CT ABDOMEN PELVIS W CONTRAST     Negative pregnancy test Labs and CT ordered - significant tenderness/guarding to RLQ, need to r/o appendicitis.  Strict ED precautions discussed  No orders of the defined types were placed in this encounter.   Return if symptoms worsen or fail to improve.  Clayborne Dana, NP

## 2023-04-03 NOTE — Addendum Note (Signed)
Addended bySilvio Pate on: 04/03/2023 11:19 AM   Modules accepted: Orders

## 2023-04-04 ENCOUNTER — Encounter: Payer: Self-pay | Admitting: Family Medicine
